# Patient Record
Sex: Female | Born: 1960 | Race: White | Hispanic: No | Marital: Married | Smoking: Current every day smoker
Health system: Southern US, Community
[De-identification: ages and names within clinical notes are randomized; demographics above are authoritative.]

## PROBLEM LIST (undated history)

## (undated) DIAGNOSIS — J449 Chronic obstructive pulmonary disease, unspecified: Secondary | ICD-10-CM

## (undated) DIAGNOSIS — F419 Anxiety disorder, unspecified: Secondary | ICD-10-CM

## (undated) DIAGNOSIS — K589 Irritable bowel syndrome without diarrhea: Secondary | ICD-10-CM

## (undated) DIAGNOSIS — M797 Fibromyalgia: Secondary | ICD-10-CM

## (undated) DIAGNOSIS — K219 Gastro-esophageal reflux disease without esophagitis: Secondary | ICD-10-CM

## (undated) DIAGNOSIS — E785 Hyperlipidemia, unspecified: Secondary | ICD-10-CM

## (undated) DIAGNOSIS — D3613 Benign neoplasm of peripheral nerves and autonomic nervous system of lower limb, including hip: Secondary | ICD-10-CM

## (undated) DIAGNOSIS — I4719 Other supraventricular tachycardia: Secondary | ICD-10-CM

## (undated) DIAGNOSIS — I471 Supraventricular tachycardia: Secondary | ICD-10-CM

## (undated) DIAGNOSIS — R072 Precordial pain: Secondary | ICD-10-CM

## (undated) HISTORY — DX: Fibromyalgia: M79.7

## (undated) HISTORY — DX: Anxiety disorder, unspecified: F41.9

## (undated) HISTORY — PX: TUBAL LIGATION: SHX77

## (undated) HISTORY — PX: GALLBLADDER SURGERY: SHX652

## (undated) HISTORY — DX: Irritable bowel syndrome, unspecified: K58.9

## (undated) HISTORY — PX: TONSILLECTOMY AND ADENOIDECTOMY: SUR1326

## (undated) HISTORY — DX: Supraventricular tachycardia: I47.1

## (undated) HISTORY — DX: Benign neoplasm of peripheral nerves and autonomic nervous system of lower limb, including hip: D36.13

## (undated) HISTORY — DX: Other supraventricular tachycardia: I47.19

## (undated) HISTORY — DX: Precordial pain: R07.2

## (undated) HISTORY — PX: VAGINAL PROLAPSE REPAIR: SHX830

## (undated) HISTORY — DX: Chronic obstructive pulmonary disease, unspecified: J44.9

## (undated) HISTORY — DX: Gastro-esophageal reflux disease without esophagitis: K21.9

## (undated) HISTORY — DX: Hyperlipidemia, unspecified: E78.5

---

## 1998-02-12 ENCOUNTER — Ambulatory Visit (HOSPITAL_COMMUNITY): Admission: RE | Admit: 1998-02-12 | Discharge: 1998-02-12 | Payer: Self-pay | Admitting: Obstetrics & Gynecology

## 1998-09-12 ENCOUNTER — Emergency Department (HOSPITAL_COMMUNITY): Admission: EM | Admit: 1998-09-12 | Discharge: 1998-09-12 | Payer: Self-pay | Admitting: Emergency Medicine

## 1999-04-19 ENCOUNTER — Other Ambulatory Visit: Admission: RE | Admit: 1999-04-19 | Discharge: 1999-04-19 | Payer: Self-pay | Admitting: Gynecology

## 1999-09-22 ENCOUNTER — Encounter: Payer: Self-pay | Admitting: Gynecology

## 1999-09-27 ENCOUNTER — Inpatient Hospital Stay (HOSPITAL_COMMUNITY): Admission: RE | Admit: 1999-09-27 | Discharge: 1999-09-28 | Payer: Self-pay | Admitting: Gynecology

## 1999-09-27 ENCOUNTER — Encounter (INDEPENDENT_AMBULATORY_CARE_PROVIDER_SITE_OTHER): Payer: Self-pay

## 2000-09-19 ENCOUNTER — Other Ambulatory Visit: Admission: RE | Admit: 2000-09-19 | Discharge: 2000-09-19 | Payer: Self-pay | Admitting: Gynecology

## 2001-01-20 ENCOUNTER — Encounter: Payer: Self-pay | Admitting: Internal Medicine

## 2001-01-20 ENCOUNTER — Emergency Department (HOSPITAL_COMMUNITY): Admission: EM | Admit: 2001-01-20 | Discharge: 2001-01-20 | Payer: Self-pay | Admitting: Emergency Medicine

## 2001-11-13 ENCOUNTER — Other Ambulatory Visit: Admission: RE | Admit: 2001-11-13 | Discharge: 2001-11-13 | Payer: Self-pay | Admitting: Gynecology

## 2003-03-10 ENCOUNTER — Encounter: Payer: Self-pay | Admitting: Endocrinology

## 2003-03-10 ENCOUNTER — Encounter: Admission: RE | Admit: 2003-03-10 | Discharge: 2003-03-10 | Payer: Self-pay | Admitting: Endocrinology

## 2003-08-14 ENCOUNTER — Emergency Department (HOSPITAL_COMMUNITY): Admission: EM | Admit: 2003-08-14 | Discharge: 2003-08-14 | Payer: Self-pay

## 2003-08-14 ENCOUNTER — Encounter: Payer: Self-pay | Admitting: Emergency Medicine

## 2004-01-20 ENCOUNTER — Ambulatory Visit (HOSPITAL_COMMUNITY): Admission: RE | Admit: 2004-01-20 | Discharge: 2004-01-20 | Payer: Self-pay | Admitting: Gastroenterology

## 2005-03-27 ENCOUNTER — Other Ambulatory Visit: Admission: RE | Admit: 2005-03-27 | Discharge: 2005-03-27 | Payer: Self-pay | Admitting: Obstetrics and Gynecology

## 2006-03-22 ENCOUNTER — Emergency Department (HOSPITAL_COMMUNITY): Admission: EM | Admit: 2006-03-22 | Discharge: 2006-03-22 | Payer: Self-pay | Admitting: Emergency Medicine

## 2006-04-25 ENCOUNTER — Encounter: Payer: Self-pay | Admitting: Emergency Medicine

## 2006-09-05 ENCOUNTER — Encounter: Admission: RE | Admit: 2006-09-05 | Discharge: 2006-09-05 | Payer: Self-pay | Admitting: Endocrinology

## 2007-07-10 ENCOUNTER — Other Ambulatory Visit: Admission: RE | Admit: 2007-07-10 | Discharge: 2007-07-10 | Payer: Self-pay | Admitting: Obstetrics & Gynecology

## 2007-07-12 ENCOUNTER — Encounter: Admission: RE | Admit: 2007-07-12 | Discharge: 2007-07-12 | Payer: Self-pay | Admitting: Obstetrics and Gynecology

## 2008-08-06 ENCOUNTER — Other Ambulatory Visit: Admission: RE | Admit: 2008-08-06 | Discharge: 2008-08-06 | Payer: Self-pay | Admitting: Obstetrics and Gynecology

## 2008-08-12 ENCOUNTER — Encounter: Admission: RE | Admit: 2008-08-12 | Discharge: 2008-08-12 | Payer: Self-pay | Admitting: Family Medicine

## 2009-09-28 ENCOUNTER — Encounter: Admission: RE | Admit: 2009-09-28 | Discharge: 2009-09-28 | Payer: Self-pay | Admitting: Obstetrics and Gynecology

## 2009-12-04 ENCOUNTER — Emergency Department (HOSPITAL_COMMUNITY): Admission: EM | Admit: 2009-12-04 | Discharge: 2009-12-05 | Payer: Self-pay | Admitting: Emergency Medicine

## 2010-08-18 ENCOUNTER — Encounter: Admission: RE | Admit: 2010-08-18 | Discharge: 2010-08-18 | Payer: Self-pay | Admitting: Internal Medicine

## 2010-10-31 ENCOUNTER — Encounter: Admission: RE | Admit: 2010-10-31 | Discharge: 2010-10-31 | Payer: Self-pay | Admitting: Obstetrics & Gynecology

## 2010-12-12 ENCOUNTER — Ambulatory Visit (HOSPITAL_COMMUNITY)
Admission: RE | Admit: 2010-12-12 | Discharge: 2010-12-13 | Payer: Self-pay | Source: Home / Self Care | Attending: Obstetrics & Gynecology | Admitting: Obstetrics & Gynecology

## 2010-12-12 ENCOUNTER — Encounter: Payer: Self-pay | Admitting: Obstetrics & Gynecology

## 2010-12-12 HISTORY — PX: LAPAROSCOPIC VAGINAL HYSTERECTOMY: SUR798

## 2011-03-06 LAB — CBC
HCT: 28.5 % — ABNORMAL LOW (ref 36.0–46.0)
Hemoglobin: 10.3 g/dL — ABNORMAL LOW (ref 12.0–15.0)
Hemoglobin: 9.6 g/dL — ABNORMAL LOW (ref 12.0–15.0)
MCV: 91.1 fL (ref 78.0–100.0)
RDW: 12.4 % (ref 11.5–15.5)
WBC: 12.5 10*3/uL — ABNORMAL HIGH (ref 4.0–10.5)

## 2011-03-06 LAB — PREGNANCY, URINE: Preg Test, Ur: NEGATIVE

## 2011-03-07 LAB — CBC
HCT: 38.5 % (ref 36.0–46.0)
MCH: 32.4 pg (ref 26.0–34.0)
Platelets: 188 10*3/uL (ref 150–400)
RDW: 12.1 % (ref 11.5–15.5)
WBC: 7.6 10*3/uL (ref 4.0–10.5)

## 2011-03-28 LAB — URINALYSIS, ROUTINE W REFLEX MICROSCOPIC
Bilirubin Urine: NEGATIVE
Hgb urine dipstick: NEGATIVE
Ketones, ur: NEGATIVE mg/dL
Nitrite: NEGATIVE
Protein, ur: NEGATIVE mg/dL
Urobilinogen, UA: 0.2 mg/dL (ref 0.0–1.0)
pH: 5.5 (ref 5.0–8.0)

## 2011-03-28 LAB — CBC: RDW: 12.3 % (ref 11.5–15.5)

## 2011-03-28 LAB — COMPREHENSIVE METABOLIC PANEL
AST: 17 U/L (ref 0–37)
Albumin: 3.5 g/dL (ref 3.5–5.2)
Calcium: 8.8 mg/dL (ref 8.4–10.5)
Chloride: 107 mEq/L (ref 96–112)
Creatinine, Ser: 0.78 mg/dL (ref 0.4–1.2)
GFR calc Af Amer: >60 mL/min (ref >60)
Glucose, Bld: 91 mg/dL (ref 70–99)
Potassium: 3.7 mEq/L (ref 3.5–5.1)
Sodium: 136 mEq/L (ref 135–145)
Total Bilirubin: 0.3 mg/dL (ref 0.3–1.2)
Total Protein: 6.6 g/dL (ref 6.0–8.3)

## 2011-03-28 LAB — DIFFERENTIAL
Basophils Absolute: 0 10*3/uL (ref 0.0–0.1)
Basophils Relative: 0 % (ref 0–1)
Eosinophils Absolute: 0.2 10*3/uL (ref 0.0–0.7)
Eosinophils Relative: 3 % (ref 0–5)
Lymphocytes Relative: 28 % (ref 12–46)
Lymphs Abs: 2.5 10*3/uL (ref 0.7–4.0)
Monocytes Absolute: 0.4 10*3/uL (ref 0.1–1.0)
Monocytes Relative: 5 % (ref 3–12)
Neutro Abs: 5.9 10*3/uL (ref 1.7–7.7)
Neutrophils Relative %: 64 % (ref 43–77)

## 2011-03-28 LAB — LIPASE, BLOOD: Lipase: 17 U/L (ref 11–59)

## 2011-03-28 LAB — POCT PREGNANCY, URINE: Preg Test, Ur: NEGATIVE

## 2011-05-12 NOTE — Op Note (Signed)
[name], Kim Morris                     ACCOUNT NO.:  [id number]   MEDICAL RECORD NO.:  [id number]                   PATIENT TYPE:  AMB   LOCATION:  ENDO                                 FACILITY:  [facility]   PHYSICIAN:  Anselmo Rod, M.D.               DATE OF BIRTH:  1961-12-05   DATE OF PROCEDURE:  01/20/2004  DATE OF DISCHARGE:                                 OPERATIVE REPORT   PROCEDURE:  Colonoscopy.   ENDOSCOPIST:  Charna Elizabeth, M.D.   INSTRUMENT USED:  Olympus video colonoscope.   INDICATIONS FOR PROCEDURE:  Change in bowel habits with severe constipation  in a 50 year old white female, rule out colonic polyps, masses, etc.   PREPROCEDURE PREPARATION:  Informed consent was obtained from the patient.  The patient was fasted for eight hours prior to the procedure and prepped  with a bottle of magnesium citrate and a gallon of GoLYTELY the night prior  to the procedure.   PREPROCEDURE PHYSICAL:  Patient with stable vital signs.  Neck supple.  Chest clear to auscultation.  S1 and S2 regular.  Abdomen soft with normal  bowel sounds.   DESCRIPTION OF PROCEDURE:  The patient was placed in the left lateral  decubitus position, sedated with 70 mg of Demerol and 7 mg Versed  intravenously.  Once the patient was adequately sedated, maintained on low  flow oxygen and continuous cardiac monitoring, the Olympus video colonoscope  was advanced into the rectum to the cecum without difficulty.  The  appendiceal orifice and ileocecal valve were clearly visualized and  photographed.  The terminal ileum appeared normal without lesion.  No  masses, polyps, erosions, ulcerations, or diverticula were seen.  Small  internal hemorrhoids were appreciated on retroflexion of the rectum.  The  patient tolerated the procedure well without complications.   IMPRESSION:  1. Normal colonoscopy to the terminal ileum except for small internal     hemorrhoids.  2. No masses or polyps seen.   RECOMMENDATIONS:  1. Continue high fiber diet with liberal fluid intake.  2. Outpatient follow up in the next two weeks for further recommendations.                                               Anselmo Rod, M.D.    JNM/MEDQ  D:  01/20/2004  T:  01/20/2004  Job:  [id number]

## 2011-12-06 ENCOUNTER — Other Ambulatory Visit: Payer: Self-pay | Admitting: Obstetrics & Gynecology

## 2011-12-06 DIAGNOSIS — Z1231 Encounter for screening mammogram for malignant neoplasm of breast: Secondary | ICD-10-CM

## 2011-12-18 ENCOUNTER — Ambulatory Visit: Payer: Managed Care, Other (non HMO)

## 2011-12-18 DIAGNOSIS — J209 Acute bronchitis, unspecified: Secondary | ICD-10-CM

## 2011-12-18 DIAGNOSIS — J029 Acute pharyngitis, unspecified: Secondary | ICD-10-CM

## 2012-01-01 ENCOUNTER — Ambulatory Visit: Payer: Self-pay

## 2012-01-08 ENCOUNTER — Ambulatory Visit: Payer: Self-pay

## 2012-01-15 ENCOUNTER — Ambulatory Visit
Admission: RE | Admit: 2012-01-15 | Discharge: 2012-01-15 | Disposition: A | Discharge status: Home / Self Care | Payer: Managed Care, Other (non HMO) | Source: Ambulatory Visit | Attending: Obstetrics & Gynecology | Admitting: Obstetrics & Gynecology

## 2012-01-15 DIAGNOSIS — Z1231 Encounter for screening mammogram for malignant neoplasm of breast: Secondary | ICD-10-CM

## 2013-07-01 ENCOUNTER — Other Ambulatory Visit: Payer: Self-pay

## 2013-07-01 DIAGNOSIS — Z1231 Encounter for screening mammogram for malignant neoplasm of breast: Secondary | ICD-10-CM

## 2013-07-15 ENCOUNTER — Ambulatory Visit
Admission: RE | Admit: 2013-07-15 | Discharge: 2013-07-15 | Disposition: A | Discharge status: Home / Self Care | Payer: Managed Care, Other (non HMO) | Source: Ambulatory Visit

## 2013-07-15 DIAGNOSIS — Z1231 Encounter for screening mammogram for malignant neoplasm of breast: Secondary | ICD-10-CM

## 2014-08-11 ENCOUNTER — Encounter: Payer: Self-pay | Admitting: Nurse Practitioner

## 2014-08-11 ENCOUNTER — Ambulatory Visit (INDEPENDENT_AMBULATORY_CARE_PROVIDER_SITE_OTHER): Payer: BC Managed Care – PPO | Admitting: Nurse Practitioner

## 2014-08-11 VITALS — BP 116/62 | HR 60 | Ht 61.5 in | Wt 118.0 lb

## 2014-08-11 DIAGNOSIS — B373 Candidiasis of vulva and vagina: Secondary | ICD-10-CM

## 2014-08-11 DIAGNOSIS — B3731 Acute candidiasis of vulva and vagina: Secondary | ICD-10-CM

## 2014-08-11 MED ORDER — FLUCONAZOLE 150 MG PO TABS: 150.0000 mg | ORAL_TABLET | Freq: Once | ORAL | Status: DC

## 2014-08-11 NOTE — Patient Instructions (Signed)
Monilial Vaginitis Vaginitis in a soreness, swelling and redness (inflammation) of the vagina and vulva. Monilial vaginitis is not a sexually transmitted infection. CAUSES  Yeast vaginitis is caused by yeast (candida) that is normally found in your vagina. With a yeast infection, the candida has overgrown in number to a point that upsets the chemical balance. SYMPTOMS   White, thick vaginal discharge.  Swelling, itching, redness and irritation of the vagina and possibly the lips of the vagina (vulva).  Burning or painful urination.  Painful intercourse. DIAGNOSIS  Things that may contribute to monilial vaginitis are:  Postmenopausal and virginal states.  Pregnancy.  Infections.  Being tired, sick or stressed, especially if you had monilial vaginitis in the past.  Diabetes. Good control will help lower the chance.  Birth control pills.  Tight fitting garments.  Using bubble bath, feminine sprays, douches or deodorant tampons.  Taking certain medications that kill germs (antibiotics).  Sporadic recurrence can occur if you become ill. TREATMENT  Your caregiver will give you medication.  There are several kinds of anti monilial vaginal creams and suppositories specific for monilial vaginitis. For recurrent yeast infections, use a suppository or cream in the vagina 2 times a week, or as directed.  Anti-monilial or steroid cream for the itching or irritation of the vulva may also be used. Get your caregiver's permission.  Painting the vagina with methylene blue solution may help if the monilial cream does not work.  Eating yogurt may help prevent monilial vaginitis. HOME CARE INSTRUCTIONS   Finish all medication as prescribed.  Do not have sex until treatment is completed or after your caregiver tells you it is okay.  Take warm sitz baths.  Do not douche.  Do not use tampons, especially scented ones.  Wear cotton underwear.  Avoid tight pants and panty  hose.  Tell your sexual partner that you have a yeast infection. They should go to their caregiver if they have symptoms such as mild rash or itching.  Your sexual partner should be treated as well if your infection is difficult to eliminate.  Practice safer sex. Use condoms.  Some vaginal medications cause latex condoms to fail. Vaginal medications that harm condoms are:  Cleocin cream.  Butoconazole (Femstat).  Terconazole (Terazol) vaginal suppository.  Miconazole (Monistat) (may be purchased over the counter). add OTC hydrocortisone mixed SEEK MEDICAL CARE IF:   You have a temperature by mouth above 102 F (38.9 C).  The infection is getting worse after 2 days of treatment.  The infection is not getting better after 3 days of treatment.  You develop blisters in or around your vagina.  You develop vaginal bleeding, and it is not your menstrual period.  You have pain when you urinate.  You develop intestinal problems.  You have pain with sexual intercourse. Document Released: 09/20/2005 Document Revised: 03/04/2012 Document Reviewed: 06/04/2009 [facility] Patient Information 2015 [place], [place]. This information is not intended to replace advice given to you by your health care provider. Make sure you discuss any questions you have with your health care provider.

## 2014-08-11 NOTE — Progress Notes (Signed)
Subjective:     Patient ID: Kim Morris, female   DOB: 11-11-1961, 53 y.o.   MRN: [id number]  HPI  This 53 yo WM Fe complains of vaginal itching.  Vaginal symptoms started on Sunday with itching and burning.  Some increase of itching last pm.   Recent antibiotics of Augmentin for URI and completed med's on Sunday.  No bladder symptoms. No fever or chills.  Last SA 8/14.   Review of Systems  Constitutional: Negative for fever, chills and fatigue.  Gastrointestinal: Negative.   Genitourinary: Positive for vaginal discharge. Negative for dysuria, urgency, frequency, flank pain, pelvic pain and dyspareunia.  Skin: Negative.   Neurological: Negative.   Psychiatric/Behavioral: Negative.        Objective:   Physical Exam  Constitutional: She is oriented to person, place, and time. She appears well-developed and well-nourished.  Abdominal: Soft. She exhibits no distension. There is no tenderness. There is no rebound and no guarding.  Genitourinary:  White thick vaginal discharge and external irritation. Wet prep: PH: 6.0, NSS: no clue cells, KOH: + yeast.  Musculoskeletal: Normal range of motion.  Neurological: She is alert and oriented to person, place, and time.  Psychiatric: She has a normal mood and affect. Her behavior is normal. Judgment and thought content normal.       Assessment:     Yeast vaginitis following Augmentin therapy for URI     Plan:    Diflucan 150 mg today and repeat in 3 days. May use OTC Monistat and Cortisone mixed for the external irritation.

## 2014-08-16 NOTE — Progress Notes (Signed)
Encounter reviewed by Dr. Brook Silva.  

## 2014-09-16 ENCOUNTER — Other Ambulatory Visit: Payer: Self-pay

## 2014-09-16 DIAGNOSIS — Z1231 Encounter for screening mammogram for malignant neoplasm of breast: Secondary | ICD-10-CM

## 2014-09-30 ENCOUNTER — Ambulatory Visit
Admission: RE | Admit: 2014-09-30 | Discharge: 2014-09-30 | Disposition: A | Discharge status: Home / Self Care | Payer: BC Managed Care – PPO | Source: Ambulatory Visit

## 2014-09-30 DIAGNOSIS — Z1231 Encounter for screening mammogram for malignant neoplasm of breast: Secondary | ICD-10-CM

## 2014-10-08 ENCOUNTER — Encounter: Payer: Self-pay | Admitting: Obstetrics & Gynecology

## 2014-10-09 ENCOUNTER — Ambulatory Visit
Admission: RE | Admit: 2014-10-09 | Discharge: 2014-10-09 | Disposition: A | Discharge status: Home / Self Care | Payer: BC Managed Care – PPO | Source: Ambulatory Visit | Attending: Obstetrics & Gynecology | Admitting: Obstetrics & Gynecology

## 2014-10-09 ENCOUNTER — Ambulatory Visit (INDEPENDENT_AMBULATORY_CARE_PROVIDER_SITE_OTHER): Payer: BC Managed Care – PPO | Admitting: Obstetrics & Gynecology

## 2014-10-09 ENCOUNTER — Encounter: Payer: Self-pay | Admitting: Obstetrics & Gynecology

## 2014-10-09 ENCOUNTER — Other Ambulatory Visit: Payer: Self-pay | Admitting: Obstetrics & Gynecology

## 2014-10-09 VITALS — BP 100/60 | HR 68 | Resp 16 | Ht 61.75 in | Wt 118.0 lb

## 2014-10-09 DIAGNOSIS — Z01419 Encounter for gynecological examination (general) (routine) without abnormal findings: Secondary | ICD-10-CM

## 2014-10-09 DIAGNOSIS — R634 Abnormal weight loss: Secondary | ICD-10-CM

## 2014-10-09 DIAGNOSIS — M797 Fibromyalgia: Secondary | ICD-10-CM

## 2014-10-09 DIAGNOSIS — Z0000 Encounter for general adult medical examination without abnormal findings: Secondary | ICD-10-CM

## 2014-10-09 DIAGNOSIS — Z124 Encounter for screening for malignant neoplasm of cervix: Secondary | ICD-10-CM

## 2014-10-09 DIAGNOSIS — N951 Menopausal and female climacteric states: Secondary | ICD-10-CM

## 2014-10-09 LAB — POCT URINALYSIS DIPSTICK
Bilirubin, UA: NEGATIVE
Glucose, UA: NEGATIVE
Ketones, UA: NEGATIVE
Leukocytes, UA: NEGATIVE
NITRITE UA: NEGATIVE
PROTEIN UA: NEGATIVE
RBC UA: NEGATIVE
Urobilinogen, UA: NEGATIVE
pH, UA: 5

## 2014-10-09 NOTE — Progress Notes (Signed)
53 y.o. [id number] MarriedCaucasianF here for annual exam.  Got new job this past May.  Full time.  Likes job.    H/O LAVH 12/11.  No vaginal bleeding since then.  Reports she is cold natured.  Having some mild hot flashes.  No vaginal dryness.    Did have blood work with August, this summer.  This was done with Dr. Noah Delaine.  Cholesterol was better.    Pt lost some weight due to stress.  Had stomach issues and pain.  Had colonoscopy and endoscopy. Some of this has improved.  She is eating better.  Gluten testing was negative.    Patient's last menstrual period was 11/24/2010.          Sexually active: Yes.    The current method of family planning is status post hysterectomy.    Exercising: No.  not regulary Smoker:  Yes 3/4-1 PPD  Health Maintenance: Pap:  08/10/10-endometrial cells on pap-had [place] and endometrial biopsy (LAVH 12/11) History of abnormal Pap:  yes MMG:  09/30/14-normal Colonoscopy:  08/14/14-repeat in 10 years BMD:   none TDaP:  2008 Screening Labs: PCP, Hb today: PCP, Urine today: negative   reports that she has been smoking Cigarettes.  She has been smoking about 0.00 packs per day. She has never used smokeless tobacco. She reports that she drinks alcohol. She reports that she does not use illicit drugs.  Past Medical History  Diagnosis Date  . Osteoporosis   . IBS (irritable bowel syndrome)   . Anxiety   . Fibromyalgia   . PAT (paroxysmal atrial tachycardia)     uses Rx PRN    Past Surgical History  Procedure Laterality Date  . Vaginal prolapse repair    . Abdominal hysterectomy      LAVH  . Cesarean section    . Tubal ligation    . Gallbladder surgery    . Tonsillectomy and adenoidectomy      Current Outpatient Prescriptions  Medication Sig Dispense Refill  . b complex vitamins tablet Take 1 tablet by mouth daily.      . cholecalciferol (VITAMIN D) 1000 UNITS tablet Take 1,000 Units by mouth daily.      . cyclobenzaprine (FLEXERIL) 10 MG tablet Take 10  mg by mouth as needed for muscle spasms.      Marland Kitchen LORazepam (ATIVAN) 1 MG tablet Take 1 mg by mouth every 8 (eight) hours.      . Multiple Vitamins-Minerals (EYE VITAMINS PO) Take by mouth.      Marland Kitchen omeprazole (PRILOSEC) 40 MG capsule Take 40 mg by mouth daily.      . Polyethylene Glycol 3350 (MIRALAX PO) Take by mouth.      . psyllium (METAMUCIL) 58.6 % packet Take 1 packet by mouth daily.      . Simethicone (GAS-X PO) Take by mouth.       No current facility-administered medications for this visit.    Family History  Problem Relation Age of Onset  . Hypertension Mother   . Cancer Father     kidney  . Diabetes Father   . Cancer Maternal Grandmother     liver  . Diabetes Maternal Grandfather   . Hypertension Maternal Grandfather   . Cancer Paternal Uncle     pancreatic  . Other Mother     PAT-mother, sister, maternal grandmother  . Thyroid disease Maternal Grandmother     ROS:  Pertinent items are noted in HPI.  Otherwise, a comprehensive ROS was negative.  Exam:   BP 100/60  Pulse 68  Resp 16  Ht 5' 1.75" (1.568 m)  Wt 118 lb (53.524 kg)  BMI 21.77 kg/m2  LMP 11/24/2010   Height: 5' 1.75" (156.8 cm)  Ht Readings from Last 3 Encounters:  10/09/14 5' 1.75" (1.568 m)  08/11/14 5' 1.5" (1.562 m)    General appearance: alert, cooperative and appears stated age Head: Normocephalic, without obvious abnormality, atraumatic Neck: no adenopathy, supple, symmetrical, trachea midline and thyroid normal to inspection and palpation Lungs: clear to auscultation bilaterally Breasts: normal appearance, no masses or tenderness Heart: regular rate and rhythm Abdomen: soft, non-tender; bowel sounds normal; no masses,  no organomegaly Extremities: extremities normal, atraumatic, no cyanosis or edema Skin: Skin color, texture, turgor normal. No rashes or lesions Lymph nodes: Cervical, supraclavicular, and axillary nodes normal. No abnormal inguinal nodes palpated Neurologic: Grossly  normal   Pelvic: External genitalia:  no lesions              Urethra:  normal appearing urethra with no masses, tenderness or lesions              Bartholins and Skenes: normal                 Vagina: normal appearing vagina with normal color and discharge, no lesions              Cervix: absent              Pap taken: No. Bimanual Exam:  Uterus:  uterus absent              Adnexa: normal adnexa and no mass, fullness, tenderness               Rectovaginal: Confirms               Anus:  normal sphincter tone, no lesions  A:  Well Woman with normal exam S/P LAVH 12/11 Smoker H/O fibromyalgia Weight loss Mild hot flashes  P:   Mammogram yearly pap smear not indicated CXR--order given [facility] today return annually or prn  An After Visit Summary was printed and given to the patient.

## 2014-10-10 LAB — FOLLICLE STIMULATING HORMONE: FSH: 6.1 m[IU]/mL

## 2014-10-13 ENCOUNTER — Telehealth: Payer: Self-pay

## 2014-10-13 NOTE — Telephone Encounter (Signed)
Lmtcb//kn 

## 2014-10-13 NOTE — Telephone Encounter (Signed)
Returning call.

## 2014-10-13 NOTE — Telephone Encounter (Signed)
Message copied by Robley Fries on Tue Oct 13, 2014 10:58 AM ------      Message from: Megan Salon      Created: Mon Oct 12, 2014 11:23 PM       Inform chest x ray was normal.  Thanks. ------

## 2014-10-15 NOTE — Telephone Encounter (Signed)
Patient informed of messages from Dr. Sabra Heck. Verbalized understanding. Will follow up prn.  Routing to provider for final review. Patient agreeable to disposition. Will close encounter

## 2014-10-15 NOTE — Telephone Encounter (Signed)
Message copied by Michele Mcalpine on Thu Oct 15, 2014 10:38 AM ------      Message from: Megan Salon      Created: Wed Oct 14, 2014  8:27 AM       Inform pt FSH is not in menopausal range.  She is still making estrogen.  Urine micro was negative. ------

## 2014-10-26 ENCOUNTER — Encounter: Payer: Self-pay | Admitting: Obstetrics & Gynecology

## 2015-01-31 ENCOUNTER — Ambulatory Visit (INDEPENDENT_AMBULATORY_CARE_PROVIDER_SITE_OTHER): Payer: BLUE CROSS/BLUE SHIELD

## 2015-01-31 ENCOUNTER — Ambulatory Visit (INDEPENDENT_AMBULATORY_CARE_PROVIDER_SITE_OTHER): Payer: BLUE CROSS/BLUE SHIELD | Admitting: Internal Medicine

## 2015-01-31 VITALS — BP 128/78 | HR 83 | Temp 98.2°F | Resp 16 | Ht 62.5 in | Wt 117.0 lb

## 2015-01-31 DIAGNOSIS — R05 Cough: Secondary | ICD-10-CM

## 2015-01-31 DIAGNOSIS — R062 Wheezing: Secondary | ICD-10-CM

## 2015-01-31 DIAGNOSIS — R059 Cough, unspecified: Secondary | ICD-10-CM

## 2015-01-31 DIAGNOSIS — J0100 Acute maxillary sinusitis, unspecified: Secondary | ICD-10-CM

## 2015-01-31 MED ORDER — AMOXICILLIN 875 MG PO TABS: 875.0000 mg | ORAL_TABLET | Freq: Two times a day (BID) | ORAL | Status: DC

## 2015-01-31 MED ORDER — PREDNISONE 20 MG PO TABS: ORAL_TABLET | ORAL | Status: DC

## 2015-01-31 NOTE — Progress Notes (Addendum)
   Subjective:  This chart was scribed for Tami Lin, MD by Dellis Filbert, ED Scribe at [facility].The patient was seen in exam room 05 and the patient's care was started at 11:31 AM.   Patient ID: Kim Morris, female    DOB: [birth year], 54 y.o.   MRN: [id number] Chief Complaint  Patient presents with  . Nasal Congestion    x3 days  . Sinusitis  . chest congestion  . Cough    clear sputum  . Back Pain  . Chest Pain   HPI HPI Comments: Kim Morris is a 54 y.o. female with a history of fibromyalgia who presents to [facility] complaining of chest and nasal congestion which began 3 days ago. On the first day she had a sore throat but this has improved. She has a cough which was worsened by mucinex. She has rhinorrhea. No fever, asthma or allergies. Has fibromyalgia, while laying on the heating pad for her back pain, she noticed CP. Deep breathing and movement causes pain in her ribs.  Smoker  Review of Systems  Constitutional: Negative for fever.  HENT: Positive for congestion and rhinorrhea.   Respiratory: Positive for cough.   Cardiovascular: Positive for chest pain.  Musculoskeletal: Positive for back pain.      Objective:   Physical Exam  Constitutional: She is oriented to person, place, and time. She appears well-developed and well-nourished. No distress.  HENT:  Head: Normocephalic and atraumatic.  Nose: Nose normal.  Mouth/Throat: No oropharyngeal exudate.  Turbinates are boggy with purulent discharge. Tender maxillary areas to percussion. The throat is clear except postnasal drainage. TMs intact.  Eyes: Conjunctivae and EOM are normal. Pupils are equal, round, and reactive to light.  Neck: Normal range of motion. No thyromegaly present.  Cardiovascular: Normal rate, regular rhythm and normal heart sounds.   No murmur heard. Pulmonary/Chest: Effort normal. No respiratory distress.  Wheezing bilaterally with expiration Rhonchi on the left  posteriorly  Chest wall TTP on the axillary area to the left.   Musculoskeletal: Normal range of motion.  Lymphadenopathy:    She has no cervical adenopathy.  Neurological: She is alert and oriented to person, place, and time.  Skin: Skin is warm and dry.  Psychiatric: She has a normal mood and affect. Her behavior is normal.  Nursing note and vitals reviewed.    [facility] reading (PRIMARY) by  Dr. 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 amoxicillin (AMOXIL) 875 MG tablet    Sig: Take 1 tablet (875 mg total) by mouth 2 (two) times daily.    Dispense:  20 tablet    Refill:  0  . predniSONE (DELTASONE) 20 MG tablet    Sig: 3/3/2/2/1/1 single daily dose for 6 days    Dispense:  12 tablet    Refill:  0   she's never used an inhaler before Follow-up in 3-4 days if not improving  I have completed the patient encounter in its entirety as documented by the scribe, with editing by me where necessary. Zorian Gunderman P. Laney Pastor, M.D.

## 2015-02-08 ENCOUNTER — Telehealth: Payer: Self-pay | Admitting: Obstetrics & Gynecology

## 2015-02-08 NOTE — Telephone Encounter (Signed)
Patient calling requesting an Rx over the phone for "a yeast infection." She declined an appointment. Pharmacy on file is correct.

## 2015-02-08 NOTE — Telephone Encounter (Signed)
Spoke with patient. She states she is on antibiotic treatment and gets a yeast infection when she has takes abx, today is her last day of treatment. Patient denies vaginal discharge. States she is having external itching only. Offered office visit for evaluation and patient declines, she was late to work this morning due to inclement weather. Advised can take OTC Monistat 3 or 7 day treatment and add OTC hydrocortisone ointment for external irritation.  Patient advised to call back if not improved with treatment. She is agreeable.  Routing to provider for final review. Patient agreeable to disposition. Will close encounter

## 2015-03-31 ENCOUNTER — Other Ambulatory Visit: Payer: Self-pay | Admitting: Dermatology

## 2015-12-03 ENCOUNTER — Ambulatory Visit (INDEPENDENT_AMBULATORY_CARE_PROVIDER_SITE_OTHER): Payer: BLUE CROSS/BLUE SHIELD | Admitting: Obstetrics & Gynecology

## 2015-12-03 ENCOUNTER — Encounter: Payer: Self-pay | Admitting: Obstetrics & Gynecology

## 2015-12-03 VITALS — BP 100/66 | HR 86 | Resp 18 | Ht 61.5 in | Wt 121.0 lb

## 2015-12-03 DIAGNOSIS — Z01419 Encounter for gynecological examination (general) (routine) without abnormal findings: Secondary | ICD-10-CM | POA: Diagnosis not present

## 2015-12-03 DIAGNOSIS — Z0000 Encounter for general adult medical examination without abnormal findings: Secondary | ICD-10-CM | POA: Diagnosis not present

## 2015-12-03 LAB — POCT URINALYSIS DIPSTICK
Bilirubin, UA: NEGATIVE
Blood, UA: NEGATIVE
GLUCOSE UA: NEGATIVE
Ketones, UA: NEGATIVE
LEUKOCYTES UA: NEGATIVE
Nitrite, UA: NEGATIVE
Protein, UA: NEGATIVE
Urobilinogen, UA: NEGATIVE
pH, UA: 7

## 2015-12-03 NOTE — Patient Instructions (Signed)
Ask Dr. Noah Delaine about pneumonia vaccine.    Ask about testing for Hepatitis C.

## 2015-12-03 NOTE — Progress Notes (Signed)
54 y.o. [id number] MarriedCaucasianF here for annual exam.  Doing well.  No vaginal bleeding.    PCP:  Dr. Noah Delaine.  Pt had blood work this summer.  Cholesterol mildly elevated.  This is being watched.   Pt reports she did try to quit over the summer.  Pt is considering trying again.     Patient's last menstrual period was 11/24/2010.          Sexually active: Yes.    The current method of family planning is status post hysterectomy.    Exercising: No.  The patient does not participate in regular exercise at present. Smoker:  yes  Health Maintenance: Pap:  07/2010 endo Bx LAVH 11/2010 History of abnormal Pap:  yes MMG:  09/30/14 BIRADS1:neg.  Pt aware and she will schedule.    Colonoscopy:  07/2014 repeat 10 years.  BMD:   Never TDaP:  2008 Screening Labs: PCP, Hb today: PCP, Urine today: negative    reports that she has been smoking Cigarettes.  She has been smoking about 1.00 pack per day. She has never used smokeless tobacco. She reports that she drinks alcohol. She reports that she does not use illicit drugs.  Past Medical History  Diagnosis Date  . Osteoporosis   . IBS (irritable bowel syndrome)   . Anxiety   . Fibromyalgia   . PAT (paroxysmal atrial tachycardia) (HCC)     uses Rx PRN  . GERD (gastroesophageal reflux disease)     Past Surgical History  Procedure Laterality Date  . Vaginal prolapse repair    . Abdominal hysterectomy      LAVH  . Cesarean section    . Tubal ligation    . Gallbladder surgery    . Tonsillectomy and adenoidectomy      Current Outpatient Prescriptions  Medication Sig Dispense Refill  . b complex vitamins tablet Take 1 tablet by mouth daily.    . cyclobenzaprine (FLEXERIL) 10 MG tablet Take 10 mg by mouth as needed for muscle spasms.    . Ibuprofen (ADVIL) 200 MG CAPS Take by mouth daily as needed.    Marland Kitchen LORazepam (ATIVAN) 1 MG tablet Take 1 mg by mouth every 8 (eight) hours.    Marland Kitchen omeprazole (PRILOSEC) 40 MG capsule Take 40 mg by mouth  daily.    . polyethylene glycol powder (GLYCOLAX/MIRALAX) powder MIX 1 CAPFUL IN 8OZ OF WATER DAILY AS NEEDED FOR CONSTIPATION  5  . psyllium (METAMUCIL) 58.6 % packet Take 1 packet by mouth daily.    . Simethicone (GAS-X PO) Take by mouth.     No current facility-administered medications for this visit.    Family History  Problem Relation Age of Onset  . Hypertension Mother   . Cancer Father     kidney  . Diabetes Father   . Cancer Maternal Grandmother     liver  . Diabetes Maternal Grandfather   . Hypertension Maternal Grandfather   . Cancer Paternal Uncle     pancreatic  . Other Mother     PAT-mother, sister, maternal grandmother  . Thyroid disease Maternal Grandmother     ROS:  Pertinent items are noted in HPI.  Otherwise, a comprehensive ROS was negative.  Exam:   BP 100/66 mmHg  Pulse 86  Resp 18  Ht 5' 1.5" (1.562 m)  Wt 121 lb (54.885 kg)  BMI 22.50 kg/m2  LMP 11/24/2010  Weight change: +3#   Height: 5' 1.5" (156.2 cm)  Ht Readings from Last 3 Encounters:  12/03/15 5' 1.5" (1.562 m)  01/31/15 5' 2.5" (1.588 m)  10/09/14 5' 1.75" (1.568 m)    General appearance: alert, cooperative and appears stated age Head: Normocephalic, without obvious abnormality, atraumatic Neck: no adenopathy, supple, symmetrical, trachea midline and thyroid normal to inspection and palpation Lungs: clear to auscultation bilaterally Breasts: normal appearance, no masses or tenderness Heart: regular rate and rhythm Abdomen: soft, non-tender; bowel sounds normal; no masses,  no organomegaly Extremities: extremities normal, atraumatic, no cyanosis or edema Skin: Skin color, texture, turgor normal. No rashes or lesions Lymph nodes: Cervical, supraclavicular, and axillary nodes normal. No abnormal inguinal nodes palpated Neurologic: Grossly normal   Pelvic: External genitalia:  no lesions              Urethra:  normal appearing urethra with no masses, tenderness or lesions               Bartholins and Skenes: normal                 Vagina: normal appearing vagina with normal color and discharge, no lesions              Cervix: absent              Pap taken: No. Bimanual Exam:  Uterus:  uterus absent              Adnexa: no mass, fullness, tenderness               Rectovaginal: Confirms               Anus:  normal sphincter tone, no lesions  Chaperone was present for exam.  A:  Well Woman with normal exam S/P LAVH 12/11 Smoker H/O fibromyalgia  P: Mammogram yearly pap smear not indicated Hep C testing discussed.  Pt states she will do with her PCP Also advised pt ask Dr. Noah Delaine about the pneumovax vaccine due to her smoking hx. return annually or prn

## 2016-03-22 ENCOUNTER — Other Ambulatory Visit: Payer: Self-pay

## 2016-03-22 DIAGNOSIS — Z1231 Encounter for screening mammogram for malignant neoplasm of breast: Secondary | ICD-10-CM

## 2016-04-07 IMAGING — CR DG CHEST 2V
2 series · 2 of 2 positions shown · non-contrast
Comparison: None.

CLINICAL DATA: Initial encounter for shortness of breath. Smoker.
Wheezing. Cough.

EXAM:
CHEST  2 VIEW

[PA]
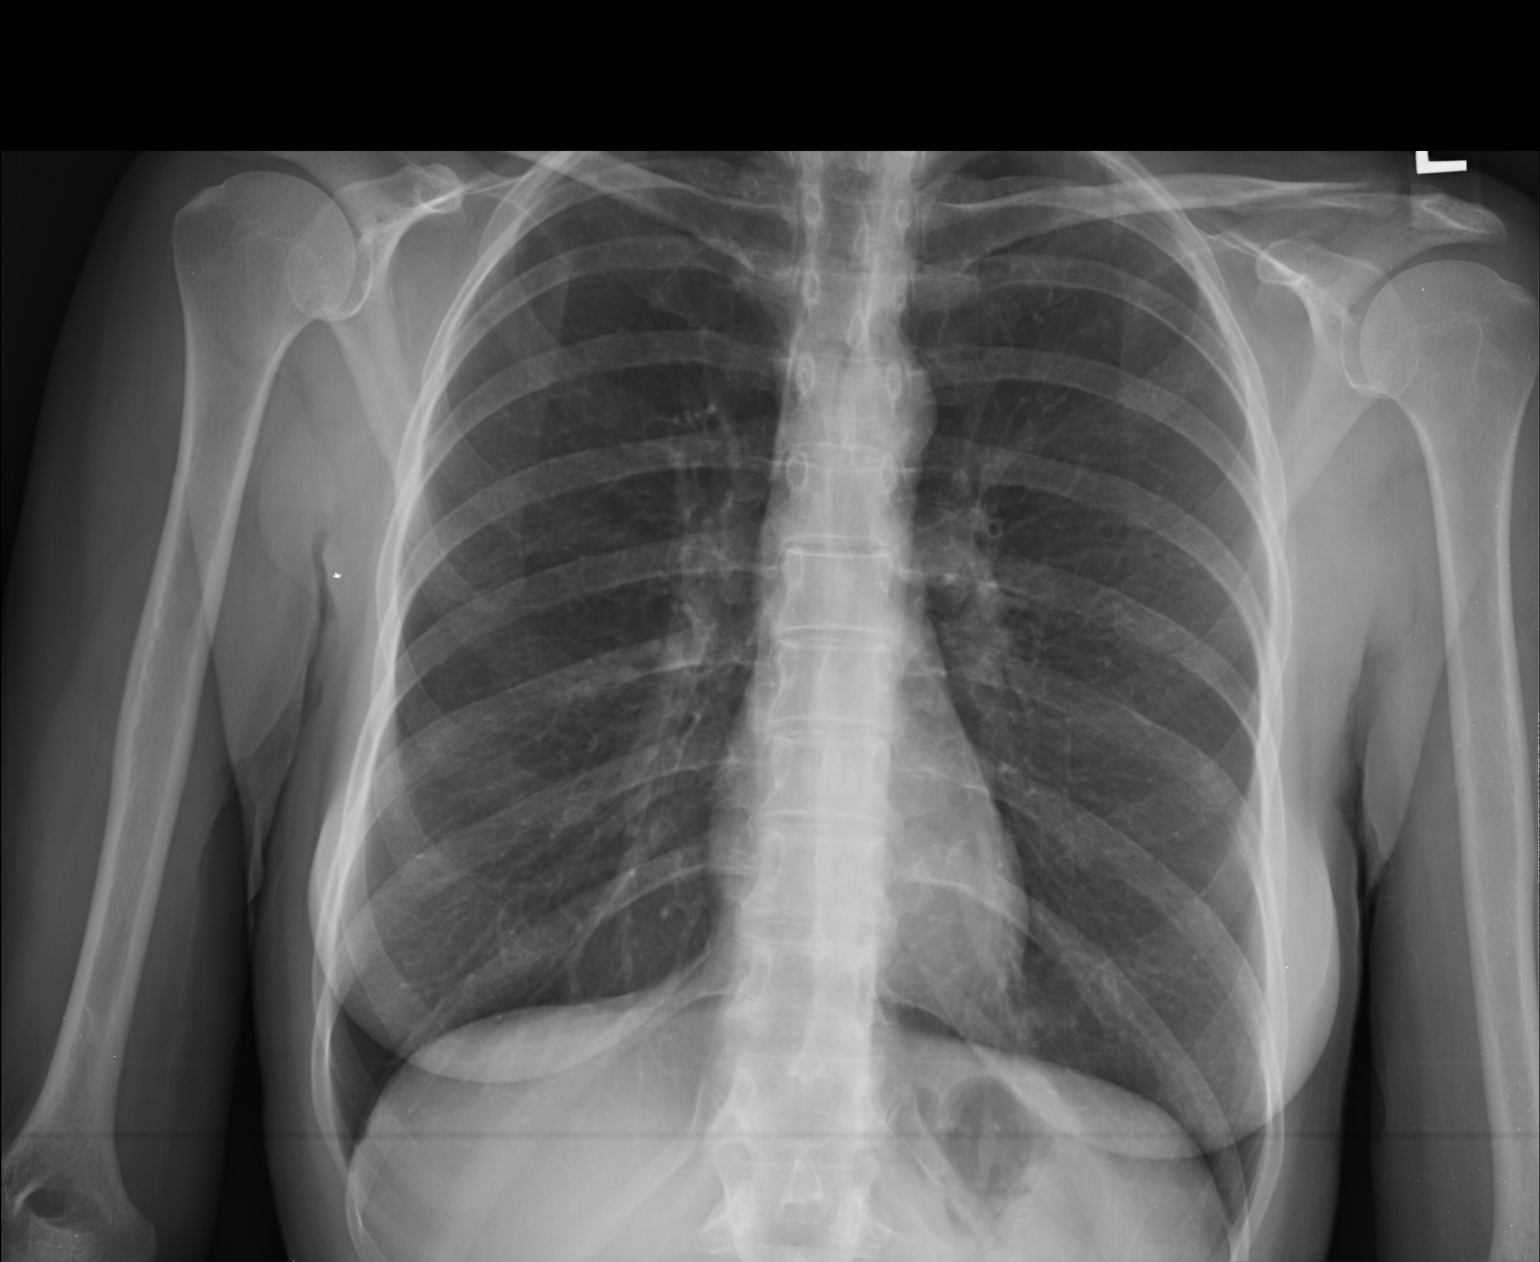

[lateral]
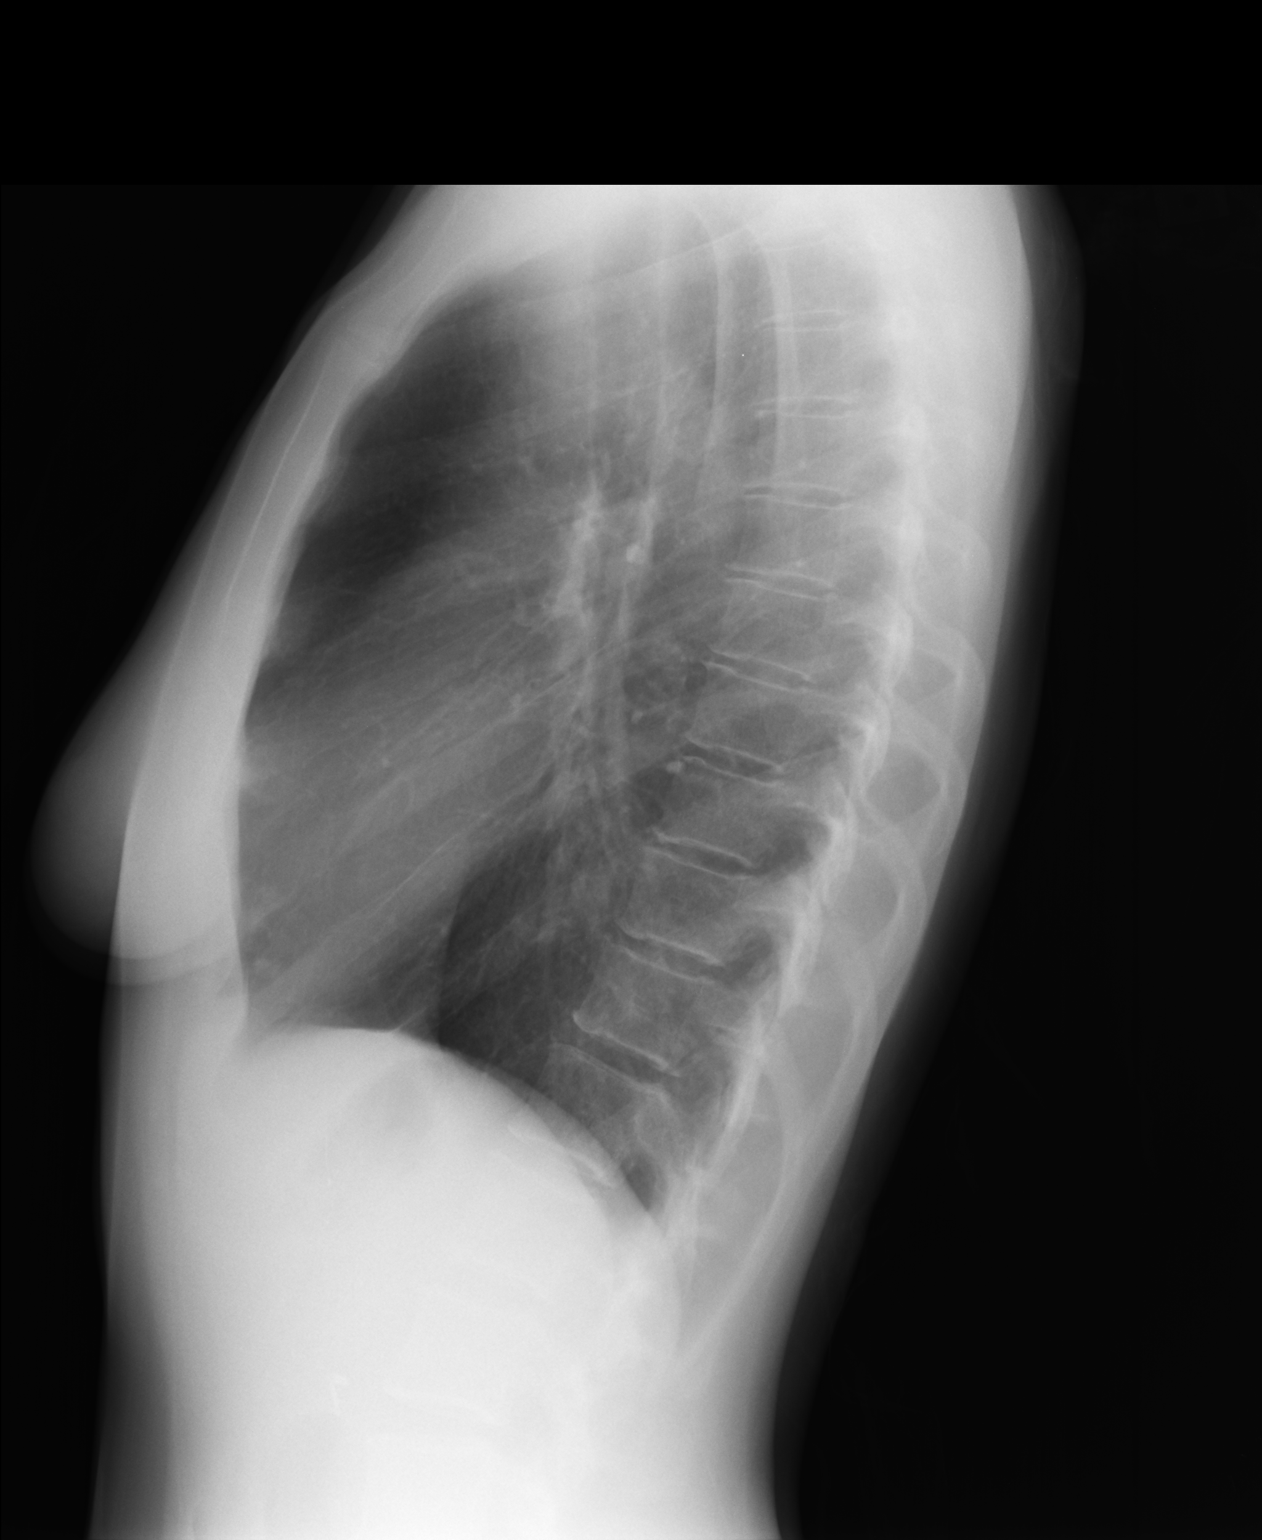

[2 of 2 positions shown; findings below may reference images not displayed]

FINDINGS: Midline trachea. Normal heart size and mediastinal contours. No
pleural effusion or pneumothorax. Mild hyperinflation with lower
lobe predominant interstitial thickening. No lobar consolidation.
IMPRESSION: Hyperinflation and interstitial thickening, likely related to COPD/
chronic bronchitis.

No acute superimposed process.

## 2016-04-19 ENCOUNTER — Ambulatory Visit
Admission: RE | Admit: 2016-04-19 | Discharge: 2016-04-19 | Disposition: A | Discharge status: Home / Self Care | Payer: BLUE CROSS/BLUE SHIELD | Source: Ambulatory Visit

## 2016-04-19 DIAGNOSIS — Z1231 Encounter for screening mammogram for malignant neoplasm of breast: Secondary | ICD-10-CM

## 2016-08-01 ENCOUNTER — Ambulatory Visit (INDEPENDENT_AMBULATORY_CARE_PROVIDER_SITE_OTHER): Payer: BLUE CROSS/BLUE SHIELD | Admitting: Obstetrics and Gynecology

## 2016-08-01 ENCOUNTER — Encounter: Payer: Self-pay | Admitting: Obstetrics and Gynecology

## 2016-08-01 VITALS — BP 130/80 | HR 78 | Resp 14 | Ht 61.5 in | Wt 124.0 lb

## 2016-08-01 DIAGNOSIS — N760 Acute vaginitis: Secondary | ICD-10-CM | POA: Diagnosis not present

## 2016-08-01 DIAGNOSIS — N9489 Other specified conditions associated with female genital organs and menstrual cycle: Secondary | ICD-10-CM

## 2016-08-01 LAB — POCT URINALYSIS DIPSTICK
Bilirubin, UA: NEGATIVE
GLUCOSE UA: NEGATIVE
Ketones, UA: NEGATIVE
Leukocytes, UA: NEGATIVE
Nitrite, UA: NEGATIVE
PH UA: 7
Protein, UA: NEGATIVE
RBC UA: NEGATIVE
UROBILINOGEN UA: NEGATIVE

## 2016-08-01 MED ORDER — BETAMETHASONE VALERATE 0.1 % EX OINT: TOPICAL_OINTMENT | CUTANEOUS | 0 refills | Status: DC

## 2016-08-01 NOTE — Progress Notes (Signed)
GYNECOLOGY  VISIT   HPI: 55 y.o.   Married  Caucasian  female   [phone] with Patient's last menstrual period was 11/24/2010.   here for vulvar burning. Burns when she is just sitting. Symptoms started about 5 days ago, seems to be worsening. Slight itching. Feels irritated. No vaginal discharge.  No change in her urinary frequency or urgency. H/O mild GSI, no change. She has some dysuria, thinks it hurts when the urine hits the skin.  H/O hysterectomy, occasional hot flashes. She has vaginal dryness with intercourse, uses lubricant with intercourse. She tried some over the counter steroid cream for 1 day without help.  GYNECOLOGIC HISTORY: Patient's last menstrual period was 11/24/2010. Contraception: Hysterectomy  Menopausal hormone therapy: None        OB History    Gravida Para Term Preterm AB Living   4 3 3  0 1 3   SAB TAB Ectopic Multiple Live Births   1 0 0 0 3         Patient Active Problem List   Diagnosis Date Noted  . Fibromyalgia 10/09/2014    Past Medical History:  Diagnosis Date  . Anxiety   . Fibromyalgia   . GERD (gastroesophageal reflux disease)   . IBS (irritable bowel syndrome)   . PAT (paroxysmal atrial tachycardia) (HCC)    uses Rx PRN    Past Surgical History:  Procedure Laterality Date  . ABDOMINAL HYSTERECTOMY     LAVH  . CESAREAN SECTION    . GALLBLADDER SURGERY    . TONSILLECTOMY AND ADENOIDECTOMY    . TUBAL LIGATION    . VAGINAL PROLAPSE REPAIR      Current Outpatient Prescriptions  Medication Sig Dispense Refill  . b complex vitamins tablet Take 1 tablet by mouth daily.    . cholecalciferol (VITAMIN D) 1000 units tablet Take 1,000 Units by mouth daily.    . cyclobenzaprine (FLEXERIL) 10 MG tablet Take 10 mg by mouth as needed for muscle spasms.    Marland Kitchen HYDROcodone-acetaminophen (NORCO/VICODIN) 5-325 MG tablet Take 1 tablet by mouth every 6 (six) hours as needed for moderate pain.    . Ibuprofen (ADVIL) 200 MG CAPS Take by mouth daily as  needed.    Marland Kitchen LORazepam (ATIVAN) 1 MG tablet Take 1 mg by mouth every 8 (eight) hours.    . Multiple Vitamins-Minerals (PRESERVISION AREDS 2 PO) Take by mouth daily.    . Omega-3 Fatty Acids (FISH OIL) 1000 MG CAPS Take by mouth.    Marland Kitchen omeprazole (PRILOSEC) 40 MG capsule Take 40 mg by mouth daily.    . polyethylene glycol powder (GLYCOLAX/MIRALAX) powder MIX 1 CAPFUL IN 8OZ OF WATER DAILY AS NEEDED FOR CONSTIPATION  5  . psyllium (METAMUCIL) 58.6 % packet Take 1 packet by mouth daily.    . Simethicone (GAS-X PO) Take by mouth.     No current facility-administered medications for this visit.      ALLERGIES: Magnesium-containing compounds; Morphine and related; Silvadene [silver sulfadiazine]; and Sulfur  Family History  Problem Relation Age of Onset  . Hypertension Mother   . Other Mother     PAT-mother, sister, maternal grandmother  . Cancer Father     kidney  . Diabetes Father   . Cancer Maternal Grandmother     liver  . Thyroid disease Maternal Grandmother   . Diabetes Maternal Grandfather   . Hypertension Maternal Grandfather   . Cancer Paternal Uncle     pancreatic    Social History  Social History  . Marital status: Married    Spouse name: N/A  . Number of children: N/A  . Years of education: N/A   Occupational History  . Not on file.   Social History Main Topics  . Smoking status: Current Every Day Smoker    Packs/day: 1.00    Types: Cigarettes  . Smokeless tobacco: Never Used     Comment: 3/4-1 PPD  . Alcohol use Yes     Comment: socially-3 per month  . Drug use: No  . Sexual activity: Yes    Partners: Male    Birth control/ protection: Surgical     Comment: LAVH   Other Topics Concern  . Not on file   Social History Narrative  . No narrative on file    Review of Systems  Constitutional: Negative.   HENT: Negative.   Eyes: Negative.   Respiratory: Negative.   Cardiovascular: Negative.   Gastrointestinal: Negative.   Genitourinary: Positive  for dysuria and frequency.  Musculoskeletal: Negative.   Skin: Negative.   Neurological: Negative.   Endo/Heme/Allergies: Negative.   Psychiatric/Behavioral: Negative.     PHYSICAL EXAMINATION:    BP 130/80 (BP Location: Left Arm, Patient Position: Sitting, Cuff Size: Normal)   Pulse 78   Resp 14   Ht 5' 1.5" (1.562 m)   Wt 124 lb (56.2 kg)   LMP 11/24/2010   BMI 23.05 kg/m      General appearance: alert, cooperative and appears stated age  Pelvic: External genitalia:  no lesions              Urethra:  normal appearing urethra with no masses, tenderness or lesions              Bartholins and Skenes: normal                 Vagina: normal appearing vagina with normal color and discharge, no lesions, mild atrophy              Cervix: absent               Chaperone was present for exam.  Wet prep: +artifact, no clear clue, no trich, + wbc KOH: no yeast PH: 4-4.5  Urine dip: negative  ASSESSMENT Vulvo-vaginitis, vaginal slides not clear PMP, mild vaginal atrophy  PLAN Will send wet prep probe Treat with steroid ointment If no vaginitis on the probe and symptoms don't improve with the steroid ointment, would recommend vaginal estrogen Reviewed vulvar skin care    An After Visit Summary was printed and given to the patient.  15 minutes face to face time of which over 50% was spent in counseling.

## 2016-08-02 LAB — WET PREP BY MOLECULAR PROBE
Candida species: NEGATIVE
GARDNERELLA VAGINALIS: NEGATIVE
Trichomonas vaginosis: NEGATIVE

## 2016-08-18 ENCOUNTER — Telehealth: Payer: Self-pay | Admitting: Obstetrics and Gynecology

## 2016-08-18 MED ORDER — ESTROGENS, CONJUGATED 0.625 MG/GM VA CREA: TOPICAL_CREAM | VAGINAL | 2 refills | Status: DC

## 2016-08-18 NOTE — Telephone Encounter (Signed)
Spoke with patient. Advised of message as seen below from [place]. Patient is agreeable and verbalizes understanding. All side effects and risks discussed with patient. Patient verbalizes understanding. Rx for Premarin vaginal cream apply 1/2 gram to vagina/vulva nightly for 2 weeks then 1/2 twice per week 30 grams 2RF sent to pharmacy on file. Patient is agreeable. Patient declines to schedule follow up appointment at this time. States her husband is in the process of getting new insurance and she will have to return call to schedule when she knows when this will go into effect.  Cc: Dr.Jertson  Routing to covering provider for final review. Patient agreeable to disposition. Will close encounter.

## 2016-08-18 NOTE — Telephone Encounter (Signed)
Ok for Premarin vaginal cream 1/2 gram total to vagina/vulva nightly for 2 weeks.  Then use 1/2 gram total to vagina/vulva twice weekly at hs. Dispense 30 grams, RF 2.  Please give precautions of increased risk of DVT, PE, MI, stroke, and potential breast cancer.  These are small doses, so the risks should be small.  Follow up with Dr. Talbert Nan in 6 weeks.

## 2016-08-18 NOTE — Telephone Encounter (Signed)
Spoke with patient. Patient reports that she has been using betamethasone valerate ointment twice per week and is still having vulvar burning. States she was advised to contact the office to start vaginal estrogen cream if symptoms persisted. She would like to try this at this time. Advised I will speak with provider regarding recommendations and return call. She is agreeable. Pharmacy on file is correct.  Notes Recorded by Polly Cobia, CMA on 08/02/2016 at 10:05 AM EDT Pt notified. Verbalized understanding. ------  Notes Recorded by Salvadore Dom, MD on 08/02/2016 at 7:41 AM EDT Please inform the patient that her vaginitis culture was negative for infection. If her symptoms aren't improving in the next several days, I would recommend we start vaginal estrogen.  Dr.Silva, please review and advise.

## 2016-08-18 NOTE — Telephone Encounter (Signed)
Patient is having burning and states Dr. Talbert Nan said to call if this happens and she would call in a cream. She is using [facility] on [address].

## 2017-02-22 DIAGNOSIS — J449 Chronic obstructive pulmonary disease, unspecified: Secondary | ICD-10-CM

## 2017-02-22 HISTORY — DX: Chronic obstructive pulmonary disease, unspecified: J44.9

## 2017-03-29 NOTE — Progress Notes (Signed)
56 y.o. [id number] MarriedCaucasianF here for annual exam.  Doing well.  Denies vaginal bleeding.  Having a grandson in six weeks. this will be a first grandson.  Very excited about this.    Diagnosed with COPD earlier this year.  Saw NP at Dr. Noland Fordyce office.  CT was recommended.  Several weeks went by and pt called back and was told CT was denied.  She feels they did not really advocate for her and that she had to follow up on this herself.  Now on Flonase and Breo.  She feels the Memory Dance has helped as she is not feeling the tightness that she was feeling before the x ray was done.  She would like to see a specialist.  She IS working on smoking cessation.  D/W pt starting pneumonia vaccination.  Will be able to do this with the pulmologist.    Patient's last menstrual period was 11/24/2010.          Sexually active: Yes.    The current method of family planning is status post hysterectomy.    Exercising: No.  yoga Smoker: yes  Health Maintenance: Pap:  07/2010 endo Bx LAVH 11/2010 History of abnormal Pap:  yes MMG:  04/19/16 BIRADS1:neg  Colonoscopy:  07/2014 f/u 10 years  BMD:   Never TDaP:  2008 - due  Pneumonia vaccine(s):  No Zostavax:   No Hep C testing: 2017 Neg--done with Dr. Noland Fordyce office Screening Labs: PCP, Urine today: pending   reports that she has been smoking Cigarettes.  She has been smoking about 1.00 pack per day. She has never used smokeless tobacco. She reports that she drinks alcohol. She reports that she does not use drugs.  Past Medical History:  Diagnosis Date  . Anxiety   . COPD (chronic obstructive pulmonary disease) ([facility]) 02/2017  . Fibromyalgia   . GERD (gastroesophageal reflux disease)   . IBS (irritable bowel syndrome)   . PAT (paroxysmal atrial tachycardia) (HCC)    uses Rx PRN    Past Surgical History:  Procedure Laterality Date  . ABDOMINAL HYSTERECTOMY     LAVH  . CESAREAN SECTION    . GALLBLADDER SURGERY    . TONSILLECTOMY AND ADENOIDECTOMY     . TUBAL LIGATION    . VAGINAL PROLAPSE REPAIR      Current Outpatient Prescriptions  Medication Sig Dispense Refill  . b complex vitamins tablet Take 1 tablet by mouth daily.    Marland Kitchen BREO ELLIPTA 100-25 MCG/INH AEPB daily.    . cetirizine (ZYRTEC) 10 MG tablet Take 10 mg by mouth daily.    . cholecalciferol (VITAMIN D) 1000 units tablet Take 1,000 Units by mouth daily.    . cyclobenzaprine (FLEXERIL) 10 MG tablet Take 10 mg by mouth as needed for muscle spasms.    . fluticasone (FLONASE) 50 MCG/ACT nasal spray 2 (two) times daily.    Marland Kitchen HYDROcodone-acetaminophen (NORCO/VICODIN) 5-325 MG tablet Take 1 tablet by mouth every 6 (six) hours as needed for moderate pain.    . Ibuprofen (ADVIL) 200 MG CAPS Take by mouth daily as needed.    Marland Kitchen LORazepam (ATIVAN) 1 MG tablet Take 1 mg by mouth at bedtime.     . Multiple Vitamins-Minerals (PRESERVISION AREDS 2 PO) Take by mouth daily.    . Omega-3 Fatty Acids (FISH OIL) 1000 MG CAPS Take by mouth.    Marland Kitchen omeprazole (PRILOSEC) 40 MG capsule Take 40 mg by mouth daily.    . polyethylene glycol powder (GLYCOLAX/MIRALAX) powder  MIX 1 CAPFUL IN 8OZ OF WATER DAILY AS NEEDED FOR CONSTIPATION  5  . psyllium (METAMUCIL) 58.6 % packet Take 1 packet by mouth daily.    . Simethicone (GAS-X PO) Take by mouth.     No current facility-administered medications for this visit.     Family History  Problem Relation Age of Onset  . Hypertension Mother   . Other Mother     PAT-mother, sister, maternal grandmother  . Cancer Father     kidney  . Diabetes Father   . Cancer Maternal Grandmother     liver  . Thyroid disease Maternal Grandmother   . Diabetes Maternal Grandfather   . Hypertension Maternal Grandfather   . Cancer Paternal Uncle     pancreatic    ROS:  Pertinent items are noted in HPI.  Otherwise, a comprehensive ROS was negative.  Exam:   BP 118/60 (BP Location: Right Arm, Patient Position: Sitting, Cuff Size: Normal)   Pulse 90   Resp 16   Ht 5'  1.5" (1.562 m)   Wt 122 lb (55.3 kg)   LMP 11/24/2010   BMI 22.68 kg/m   Weight change: -1#  Height: 5' 1.5" (156.2 cm)  Ht Readings from Last 3 Encounters:  03/30/17 5' 1.5" (1.562 m)  08/01/16 5' 1.5" (1.562 m)  12/03/15 5' 1.5" (1.562 m)    General appearance: alert, cooperative and appears stated age Head: Normocephalic, without obvious abnormality, atraumatic Neck: no adenopathy, supple, symmetrical, trachea midline and thyroid non-palpable Lungs: clear to auscultation bilaterally Breasts: normal appearance, no masses or tenderness Heart: regular rate and rhythm Abdomen: soft, non-tender; bowel sounds normal; no masses,  no organomegaly Extremities: extremities normal, atraumatic, no cyanosis or edema Skin: Skin color, texture, turgor normal. No rashes or lesions Lymph nodes: Cervical, supraclavicular, and axillary nodes normal. No abnormal inguinal nodes palpated Neurologic: Grossly normal   Pelvic: External genitalia:  no lesions              Urethra:  normal appearing urethra with no masses, tenderness or lesions              Bartholins and Skenes: normal                 Vagina: normal appearing vagina with normal color and discharge, no lesions              Cervix: absent              Pap taken: No. Bimanual Exam:  Uterus:  uterus absent              Adnexa: normal adnexa and no mass, fullness, tenderness               Rectovaginal: Confirms               Anus:  normal sphincter tone, no lesions  Chaperone was present for exam.  A:  Well Woman with normal exam PMP S/p LAVH 12/11 COPD Smoker but working on stopping  Fibromyalgia Vulvar irritation, normal exam  P:   Mammogram guidelines reviewed pap smear not indicated Referral to pulmonology.  Pt will likely start pneumonia vaccines when she has this appt.  Pt advised to contact PCP and get copy of chest xray to take to pulmonology appt.  States she will take care of this. Replens vaginal moisturizer vs  coconut oil vs olive oil topically reviewed.  She does not want to use any topical estrogen products. return annually or  prn

## 2017-03-30 ENCOUNTER — Ambulatory Visit (INDEPENDENT_AMBULATORY_CARE_PROVIDER_SITE_OTHER): Payer: 59 | Admitting: Obstetrics & Gynecology

## 2017-03-30 ENCOUNTER — Encounter: Payer: Self-pay | Admitting: Obstetrics & Gynecology

## 2017-03-30 VITALS — BP 118/60 | HR 90 | Resp 16 | Ht 61.5 in | Wt 122.0 lb

## 2017-03-30 DIAGNOSIS — J441 Chronic obstructive pulmonary disease with (acute) exacerbation: Secondary | ICD-10-CM

## 2017-03-30 DIAGNOSIS — Z01419 Encounter for gynecological examination (general) (routine) without abnormal findings: Secondary | ICD-10-CM

## 2017-03-30 DIAGNOSIS — Z0000 Encounter for general adult medical examination without abnormal findings: Secondary | ICD-10-CM

## 2017-03-30 DIAGNOSIS — Z23 Encounter for immunization: Secondary | ICD-10-CM

## 2017-03-30 DIAGNOSIS — F17200 Nicotine dependence, unspecified, uncomplicated: Secondary | ICD-10-CM

## 2017-03-30 LAB — POCT URINALYSIS DIPSTICK
BILIRUBIN UA: NEGATIVE
Blood, UA: NEGATIVE
GLUCOSE UA: NEGATIVE
KETONES UA: NEGATIVE
LEUKOCYTES UA: NEGATIVE
Nitrite, UA: NEGATIVE
PROTEIN UA: NEGATIVE
Urobilinogen, UA: NEGATIVE (ref ?–2.0)
pH, UA: 7 (ref 5.0–8.0)

## 2017-03-30 NOTE — Patient Instructions (Signed)
Replens moisturizer or olive oil or coconut oil

## 2017-04-17 DIAGNOSIS — M26629 Arthralgia of temporomandibular joint, unspecified side: Secondary | ICD-10-CM | POA: Insufficient documentation

## 2017-06-12 ENCOUNTER — Encounter: Payer: Self-pay | Admitting: Pulmonary Disease

## 2017-06-12 ENCOUNTER — Ambulatory Visit (INDEPENDENT_AMBULATORY_CARE_PROVIDER_SITE_OTHER): Payer: Managed Care, Other (non HMO) | Admitting: Pulmonary Disease

## 2017-06-12 VITALS — BP 122/68 | HR 68 | Ht 62.0 in | Wt 122.0 lb

## 2017-06-12 DIAGNOSIS — J449 Chronic obstructive pulmonary disease, unspecified: Secondary | ICD-10-CM | POA: Insufficient documentation

## 2017-06-12 DIAGNOSIS — J439 Emphysema, unspecified: Secondary | ICD-10-CM | POA: Diagnosis not present

## 2017-06-12 NOTE — Patient Instructions (Addendum)
Lung function is decreased at 70% Stay on BREO once daily - rinse mouth after use  He have to make another attempt to quit smoking. Trial of nicotine patch 14 mg/daily After you have tried this, we can give you a prescription for nicotine inhaler if needed

## 2017-06-12 NOTE — Assessment & Plan Note (Addendum)
Lung function is decreased at 70% Stay on BREO once daily - rinse mouth after use  Emphasized smoking cessation is the most important intervention at this stage -have to make another attempt to quit smoking. Trial of nicotine patch 14 mg/daily After you have tried this, we can give you a prescription for nicotine inhaler if needed  She will start on activity program to avoid gaining weight

## 2017-06-12 NOTE — Progress Notes (Signed)
Subjective:    Patient ID: Kim Morris, female    DOB: [birth year], 55 y.o.   MRN: [id number]  HPI   56 year old smoker presents for evaluation of dyspnea and exertion and abnormal chest x-ray. She smokes about a pack per day since her teenage years-more than 30-pack-years. 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 She reports dyspnea on exertion for the last 6 months sometimes undergoing routine household work. She reports an occasional wheeze about once a week. She reports both sinus and lower airway congestion. She reports a chronic early morning cough productive of minimal brown sputum and occasionally clear. She underwent chest x-ray 02/2017 PCP which showed hyperinflation. I have reviewed these films and also her prior films. She was given breo with seem to help with her breathing  She has tried to quit smoking and 4 occasions and has gone as long as about a week. She has not tried patch and does not want to use the gum. She lives with her husband who also smokes. She denies frequent chest colds   Spirometry showed a ratio 75, FEV1 of 69% and FVC of 72%- although this is more suggestive of mild restriction in particular history this favors airway obstruction    Past Medical History:  Diagnosis Date  . Anxiety   . COPD (chronic obstructive pulmonary disease) ([facility]) 02/2017  . Fibromyalgia   . GERD (gastroesophageal reflux disease)   . IBS (irritable bowel syndrome)   . PAT (paroxysmal atrial tachycardia) (HCC)    uses Rx PRN    Past Surgical History:  Procedure Laterality Date  . ABDOMINAL HYSTERECTOMY     LAVH  . CESAREAN SECTION    . GALLBLADDER SURGERY    . TONSILLECTOMY AND ADENOIDECTOMY    . TUBAL LIGATION    . VAGINAL PROLAPSE REPAIR       Allergies  Allergen Reactions  . Magnesium-Containing Compounds   . Morphine And Related   . Silvadene [Silver Sulfadiazine]   . Sulfur    Social History   Social History  . Marital status:  Married    Spouse name: N/A  . Number of children: N/A  . Years of education: N/A   Occupational History  . Not on file.   Social History Main Topics  . Smoking status: Current Every Day Smoker    Packs/day: 1.00    Years: 36.00    Types: Cigarettes    Start date: 06/12/1981  . Smokeless tobacco: Never Used     Comment: 3/4-1 PPD  . Alcohol use Yes     Comment: socially-3 per month  . Drug use: No  . Sexual activity: Yes    Partners: Male    Birth control/ protection: Surgical     Comment: LAVH   Other Topics Concern  . Not on file   Social History Narrative  . No narrative on file      Family History  Problem Relation Age of Onset  . Hypertension Mother   . Other Mother        PAT-mother, sister, maternal grandmother  . COPD Mother   . Cancer Father        kidney  . Diabetes Father   . Kidney cancer Father   . Cancer Maternal Grandmother        liver  . Thyroid disease Maternal Grandmother   . Hypertension Maternal Grandmother   . Diabetes Maternal Grandmother   . Liver cancer Maternal Grandfather   . Cancer  Paternal Uncle        pancreatic     Review of Systems  Constitutional: Negative for fever and unexpected weight change.  HENT: Negative for congestion, dental problem, ear pain, nosebleeds, postnasal drip, rhinorrhea, sinus pressure, sneezing, sore throat and trouble swallowing.   Eyes: Negative for redness and itching.  Respiratory: Negative for cough, chest tightness, shortness of breath and wheezing.   Cardiovascular: Negative for palpitations and leg swelling.  Gastrointestinal: Negative for nausea and vomiting.  Genitourinary: Negative for dysuria.  Musculoskeletal: Negative for joint swelling.  Skin: Negative for rash.  Allergic/Immunologic: Negative.  Negative for environmental allergies, food allergies and immunocompromised state.  Neurological: Negative for headaches.  Hematological: Does not bruise/bleed easily.    Psychiatric/Behavioral: Negative for dysphoric mood. The patient is not nervous/anxious.        Objective:   Physical Exam   Gen. Pleasant, well-nourished, in no distress, normal affect ENT - no lesions, no post nasal drip Neck: No JVD, no thyromegaly, no carotid bruits Lungs: no use of accessory muscles, no dullness to percussion, decreased BL  without rales or rhonchi  Cardiovascular: Rhythm regular, heart sounds  normal, no murmurs or gallops, no peripheral edema Abdomen: soft and non-tender, no hepatosplenomegaly, BS normal. Musculoskeletal: No deformities, no cyanosis or clubbing Neuro:  alert, non focal        Assessment & Plan:

## 2017-08-03 ENCOUNTER — Telehealth: Payer: Self-pay | Admitting: Pulmonary Disease

## 2017-08-03 MED ORDER — NICOTINE 14 MG/24HR TD PT24
14.0000 mg | MEDICATED_PATCH | Freq: Every day | TRANSDERMAL | 2 refills | Status: DC
Start: 2017-08-03 — End: 2017-10-15

## 2017-08-03 NOTE — Telephone Encounter (Signed)
Spoke with pt, aware of recs.  rx sent to preferred pharmacy.  Nothing further needed.  

## 2017-08-03 NOTE — Telephone Encounter (Signed)
Okay to send #30 x 3 refills

## 2017-08-03 NOTE — Telephone Encounter (Addendum)
Called and spoke with pt. Pt is requesting Rx for nicotine patches.   Per 06/12/17 OV note- RA states trail of nicotine patches 14mg /daily.  It does not appear that this Rx was sent to pharmacy.  RA please advise if okay to send Rx. Thanks.

## 2017-10-15 ENCOUNTER — Ambulatory Visit (INDEPENDENT_AMBULATORY_CARE_PROVIDER_SITE_OTHER): Payer: Managed Care, Other (non HMO) | Admitting: Obstetrics & Gynecology

## 2017-10-15 ENCOUNTER — Telehealth: Payer: Self-pay | Admitting: Obstetrics & Gynecology

## 2017-10-15 ENCOUNTER — Encounter: Payer: Self-pay | Admitting: Obstetrics & Gynecology

## 2017-10-15 ENCOUNTER — Ambulatory Visit: Payer: 59 | Admitting: Obstetrics & Gynecology

## 2017-10-15 VITALS — BP 108/60 | HR 76 | Resp 14 | Wt 129.0 lb

## 2017-10-15 DIAGNOSIS — N951 Menopausal and female climacteric states: Secondary | ICD-10-CM

## 2017-10-15 DIAGNOSIS — R309 Painful micturition, unspecified: Secondary | ICD-10-CM

## 2017-10-15 DIAGNOSIS — N898 Other specified noninflammatory disorders of vagina: Secondary | ICD-10-CM

## 2017-10-15 DIAGNOSIS — R300 Dysuria: Secondary | ICD-10-CM | POA: Diagnosis not present

## 2017-10-15 LAB — POCT URINALYSIS DIPSTICK
Bilirubin, UA: NEGATIVE
Glucose, UA: NEGATIVE
KETONES UA: NEGATIVE
Leukocytes, UA: NEGATIVE
Nitrite, UA: NEGATIVE
PROTEIN UA: NEGATIVE
RBC UA: NEGATIVE
UROBILINOGEN UA: 0.2 U/dL
pH, UA: 7 (ref 5.0–8.0)

## 2017-10-15 MED ORDER — ESTROGENS, CONJUGATED 0.625 MG/GM VA CREA: TOPICAL_CREAM | VAGINAL | 1 refills | Status: DC

## 2017-10-15 MED ORDER — ESTRADIOL 0.05 MG/24HR TD PTTW: 1.0000 | MEDICATED_PATCH | TRANSDERMAL | 2 refills | Status: DC

## 2017-10-15 NOTE — Telephone Encounter (Signed)
Spoke with patient. Patient states that she is having urinary frequency and burning with urination. Denies lower back pain, fever, and chills. Is also having increased hot flashes and night sweats. "They are lasting all night long. I am waking up multiple times a night for 2 weeks now." Advised will need to be seen for further evaluation. Patient is agreeable. Appointment scheduled for 10/15/2017 at 3:45 pm with Dr.Miller. Patient is agreeable to date and time.  Routing to provider for final review. Patient agreeable to disposition. Will close encounter.

## 2017-10-15 NOTE — Telephone Encounter (Signed)
Patient called requesting an appointment for burning and stinging when urinating and problems with her hormones.

## 2017-10-15 NOTE — Progress Notes (Signed)
GYNECOLOGY  VISIT  CC:   Urinary urgency, hot flashes/night sweats  HPI:  56 y.o. G29P3013 Married Caucasian female here for complaints of urinary urgency that has been going on for several seeks as well as significant hot flashes and night sweats.  She is having trouble with sleeping due to the night sweats.  She just feels exhausted.  Wants to talk about options for helping with hot flashes.  Wants to discuss hormonal and non hormonal options.  Pt doesn't feel urinary urgency is like prior UTIs she's had in the past.  Denies dysuria--just feels like she has to go a lot more and doesn't feel like she completely empties.  Denies back pain, pelvic pain, or fever.    GYNECOLOGIC HISTORY: Patient's last menstrual period was 11/24/2010. Contraception: Hysterectomy Menopausal hormone therapy: none  Patient Active Problem List   Diagnosis Date Noted  . COPD (chronic obstructive pulmonary disease) ([place]) 06/12/2017  . Fibromyalgia 10/09/2014    Past Medical History:  Diagnosis Date  . Anxiety   . COPD (chronic obstructive pulmonary disease) ([place]) 02/2017  . Fibromyalgia   . GERD (gastroesophageal reflux disease)   . IBS (irritable bowel syndrome)   . PAT (paroxysmal atrial tachycardia) ([facility])    uses Rx PRN    Past Surgical History:  Procedure Laterality Date  . ABDOMINAL HYSTERECTOMY     [facility]  . CESAREAN SECTION    . GALLBLADDER SURGERY    . TONSILLECTOMY AND ADENOIDECTOMY    . TUBAL LIGATION    . VAGINAL PROLAPSE REPAIR      MEDS:   Current Outpatient Prescriptions on File Prior to Visit  Medication Sig Dispense Refill  . b complex vitamins tablet Take 1 tablet by mouth daily.    Marland Kitchen BREO ELLIPTA 100-25 MCG/INH AEPB daily.    . cetirizine (ZYRTEC) 10 MG tablet Take 10 mg by mouth daily.    . cholecalciferol (VITAMIN D) 1000 units tablet Take 2,000 Units by mouth every other day.     . cyclobenzaprine (FLEXERIL) 10 MG tablet Take 10 mg by mouth as needed for muscle spasms.    .  fluticasone (FLONASE) 50 MCG/ACT nasal spray 2 (two) times daily.    Marland Kitchen HYDROcodone-acetaminophen (NORCO/VICODIN) 5-325 MG tablet Take 1 tablet by mouth every 6 (six) hours as needed for moderate pain.    . Ibuprofen (ADVIL) 200 MG CAPS Take by mouth daily as needed.    Marland Kitchen LORazepam (ATIVAN) 1 MG tablet Take 1 mg by mouth at bedtime.     . Multiple Vitamins-Minerals (PRESERVISION AREDS 2 PO) Take by mouth daily.    . nicotine (NICODERM CQ - DOSED IN MG/24 HOURS) 14 mg/24hr patch Place 1 patch (14 mg total) onto the skin daily. 30 patch 2  . Omega-3 Fatty Acids (FISH OIL) 1000 MG CAPS Take by mouth.    Marland Kitchen omeprazole (PRILOSEC) 40 MG capsule Take 40 mg by mouth daily.    . polyethylene glycol powder (GLYCOLAX/MIRALAX) powder MIX 1 CAPFUL IN 8OZ OF WATER DAILY AS NEEDED FOR CONSTIPATION  5  . psyllium (METAMUCIL) 58.6 % packet Take 1 packet by mouth daily.    . Simethicone (GAS-X PO) Take by mouth.     No current facility-administered medications on file prior to visit.     ALLERGIES: Magnesium-containing compounds; Morphine and related; Silvadene [silver sulfadiazine]; and Sulfur  Family History  Problem Relation Age of Onset  . Hypertension Mother   . Other Mother  PAT-mother, sister, maternal grandmother  . COPD Mother   . Cancer Father        kidney  . Diabetes Father   . Kidney cancer Father   . Cancer Maternal Grandmother        liver  . Thyroid disease Maternal Grandmother   . Hypertension Maternal Grandmother   . Diabetes Maternal Grandmother   . Liver cancer Maternal Grandfather   . Cancer Paternal Uncle        pancreatic    SH:  Married, smoker  Review of Systems  Constitutional: Negative.   Respiratory: Negative.   Cardiovascular: Negative.   Gastrointestinal: Negative.   Genitourinary: Positive for frequency and urgency. Negative for dysuria, flank pain and hematuria.    PHYSICAL EXAMINATION:    BP 108/60 (BP Location: Right Arm, Patient Position: Sitting,  Cuff Size: Normal)   Pulse 76   Resp 14   Wt 129 lb (58.5 kg)   LMP 11/24/2010   BMI 23.59 kg/m     General appearance: alert, cooperative and appears stated age CV:  Regular rate and rhythm Lungs:  clear to auscultation, no wheezes, rales or rhonchi, symmetric air entry Flank: no CVA tendernss Abdomen: soft, non-tender; bowel sounds normal; no masses,  no organomegaly  Pelvic: External genitalia:  no lesions              Urethra:  normal appearing urethra with no masses, tenderness or lesions              Bartholins and Skenes: normal                 Vagina: normal appearing vagina with normal color , watery discharge noted, no lesions              Cervix: absent              Bimanual Exam:  Uterus:  uterus absent              Adnexa: no mass, fullness, tenderness              Anus:  no lesions  Chaperone was present for exam.  Lengthy discussion about hormonal and non-hormonal symptoms occurred.  Although pt states she is really interested in non-hormonal options, she did not like any we discussed.  We proceeded to discuss HRT, risks and benefits, specifically the WHI study including but not limited to risks of increased risks of heart disease, MI, stroke, DVT, and breast cancer.  Increased risks of gall bladder disease and change in cholesterol panels also discussed.  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  Benefits of improved quality of life, improved bone density and decreased risks of colon cancer also discussed.  She is desirous of starting estrogen  Assessment: Vasomotor symptoms Urinary frequency/urgency likely due to hormonal change Watery vaginal discharge  Plan: Urine culture pending.  Affirm pending as well. Will start estradiol patches 0.05mg  twice weekly.  Will recheck in 2-3 months.   ~25 minutes spent with patient >50% of time was in face to face discussion of above.

## 2017-10-16 LAB — URINE CULTURE: Organism ID, Bacteria: NO GROWTH

## 2017-10-16 LAB — VAGINITIS/VAGINOSIS, DNA PROBE
CANDIDA SPECIES: NEGATIVE
GARDNERELLA VAGINALIS: NEGATIVE
TRICHOMONAS VAG: NEGATIVE

## 2017-10-17 ENCOUNTER — Encounter: Payer: Self-pay | Admitting: Obstetrics & Gynecology

## 2017-11-26 ENCOUNTER — Encounter: Payer: Self-pay | Admitting: Adult Health

## 2017-11-26 ENCOUNTER — Encounter: Payer: Self-pay | Admitting: Obstetrics & Gynecology

## 2017-11-26 ENCOUNTER — Other Ambulatory Visit: Payer: Self-pay

## 2017-11-26 ENCOUNTER — Ambulatory Visit (INDEPENDENT_AMBULATORY_CARE_PROVIDER_SITE_OTHER): Payer: Managed Care, Other (non HMO) | Admitting: Adult Health

## 2017-11-26 ENCOUNTER — Ambulatory Visit (INDEPENDENT_AMBULATORY_CARE_PROVIDER_SITE_OTHER): Payer: Managed Care, Other (non HMO) | Admitting: Obstetrics & Gynecology

## 2017-11-26 VITALS — BP 136/70 | HR 72 | Resp 16 | Wt 131.0 lb

## 2017-11-26 DIAGNOSIS — Z720 Tobacco use: Secondary | ICD-10-CM | POA: Diagnosis not present

## 2017-11-26 DIAGNOSIS — J449 Chronic obstructive pulmonary disease, unspecified: Secondary | ICD-10-CM | POA: Diagnosis not present

## 2017-11-26 DIAGNOSIS — Z9229 Personal history of other drug therapy: Secondary | ICD-10-CM | POA: Insufficient documentation

## 2017-11-26 DIAGNOSIS — N951 Menopausal and female climacteric states: Secondary | ICD-10-CM | POA: Diagnosis not present

## 2017-11-26 MED ORDER — ALBUTEROL SULFATE HFA 108 (90 BASE) MCG/ACT IN AERS: 2.0000 | INHALATION_SPRAY | Freq: Four times a day (QID) | RESPIRATORY_TRACT | 2 refills | Status: DC | PRN

## 2017-11-26 NOTE — Patient Instructions (Signed)
Dr. Suzi Roots [address], [place], [place] [address] Phone: [phone]

## 2017-11-26 NOTE — Assessment & Plan Note (Signed)
Smoking cessation  

## 2017-11-26 NOTE — Progress Notes (Signed)
@Patient  ID: Kim Morris, female    DOB: [birth year], 56 y.o.   MRN: [id number]  Chief Complaint  Patient presents with  . Follow-up    COPD     Referring provider: Thressa Sheller, MD  HPI: 56 yo female smoker followed for COPD   TEST  Spirometry showed a ratio 75, FEV1 of 69% and FVC of 72%- although this is more suggestive of mild restriction in particular history this favors airway obstruction   11/26/2017 Follow up : COPD  Pt returns for 6 month follow up for COPD . She is on BREO . Feels her breathing is doing about the same. Gets winded on occasion . No flare of cough or wheezing .  Still smoking . Cessation discussed. Went over helpful hints on quitting  CXR earlier this year with PCP reported as COPD changes.     Allergies  Allergen Reactions  . Magnesium-Containing Compounds   . Morphine And Related   . Silvadene [Silver Sulfadiazine]   . Sulfur     Immunization History  Administered Date(s) Administered  . Influenza Split 08/23/2017  . Tdap 03/30/2017    Past Medical History:  Diagnosis Date  . Anxiety   . COPD (chronic obstructive pulmonary disease) ([place]) 02/2017  . Fibromyalgia   . GERD (gastroesophageal reflux disease)   . IBS (irritable bowel syndrome)   . PAT (paroxysmal atrial tachycardia) ([facility])    uses Rx PRN    Tobacco History: Social History   Tobacco Use  Smoking Status Current Every Day Smoker  . Packs/day: 1.00  . Years: 36.00  . Pack years: 36.00  . Types: Cigarettes  . Start date: 06/12/1981  Smokeless Tobacco Never Used  Tobacco Comment   3/4-1 PPD   Ready to quit: No Counseling given: Yes Comment: 3/4-1 PPD   Outpatient Encounter Medications as of 11/26/2017  Medication Sig  . albuterol (PROAIR HFA) 108 (90 Base) MCG/ACT inhaler Inhale 2 puffs into the lungs every 6 (six) hours as needed for wheezing or shortness of breath.  Marland Kitchen b complex vitamins tablet Take 1 tablet by mouth daily.  Marland Kitchen BREO ELLIPTA 100-25 MCG/INH  AEPB daily.  . cetirizine (ZYRTEC) 10 MG tablet Take 10 mg by mouth daily.  . cholecalciferol (VITAMIN D) 1000 units tablet Take 2,000 Units by mouth every other day.   . conjugated estrogens (PREMARIN) vaginal cream 1/2 gram vaginally twice weekly or externally with a small amount daily  . cyclobenzaprine (FLEXERIL) 10 MG tablet Take 10 mg by mouth as needed for muscle spasms.  Marland Kitchen estradiol (VIVELLE-DOT) 0.05 MG/24HR patch Place 1 patch (0.05 mg total) onto the skin 2 (two) times a week.  . fluticasone (FLONASE) 50 MCG/ACT nasal spray 2 (two) times daily.  Marland Kitchen HYDROcodone-acetaminophen (NORCO/VICODIN) 5-325 MG tablet Take 1 tablet by mouth every 6 (six) hours as needed for moderate pain.  . Ibuprofen (ADVIL) 200 MG CAPS Take by mouth daily as needed.  Marland Kitchen LORazepam (ATIVAN) 1 MG tablet Take 1 mg by mouth at bedtime.   . Multiple Vitamins-Minerals (PRESERVISION AREDS 2 PO) Take by mouth daily.  . Omega-3 Fatty Acids (FISH OIL) 1000 MG CAPS Take by mouth.  Marland Kitchen omeprazole (PRILOSEC) 40 MG capsule Take 40 mg by mouth daily.  . polyethylene glycol powder (GLYCOLAX/MIRALAX) powder MIX 1 CAPFUL IN 8OZ OF WATER DAILY AS NEEDED FOR CONSTIPATION  . psyllium (METAMUCIL) 58.6 % packet Take 1 packet by mouth daily.  . Simethicone (GAS-X PO) Take by mouth.   No  facility-administered encounter medications on file as of 11/26/2017.      Review of Systems  Constitutional:   No  weight loss, night sweats,  Fevers, chills, fatigue, or  lassitude.  HEENT:   No headaches,  Difficulty swallowing,  Tooth/dental problems, or  Sore throat,                No sneezing, itching, ear ache, nasal congestion, post nasal drip,   CV:  No chest pain,  Orthopnea, PND, swelling in lower extremities, anasarca, dizziness, palpitations, syncope.   GI  No heartburn, indigestion, abdominal pain, nausea, vomiting, diarrhea, change in bowel habits, loss of appetite, bloody stools.   Resp:   No excess mucus, no productive cough,  No  non-productive cough,  No coughing up of blood.  No change in color of mucus.  No wheezing.  No chest wall deformity  Skin: no rash or lesions.  GU: no dysuria, change in color of urine, no urgency or frequency.  No flank pain, no hematuria   MS:  No joint pain or swelling.  No decreased range of motion.  No back pain.    Physical Exam  BP 112/64 (BP Location: Left Arm, Cuff Size: Normal)   Pulse (!) 56   Ht 5\' 2"  (1.575 m)   Wt 131 lb 6.4 oz (59.6 kg)   LMP 11/24/2010   SpO2 100%   BMI 24.03 kg/m   GEN: A/Ox3; pleasant , NAD, well nourished.    HEENT:  [place]/AT,  EACs-clear, TMs-wnl, NOSE-clear, THROAT-clear, no lesions, no postnasal drip or exudate noted.   NECK:  Supple w/ fair ROM; no JVD; normal carotid impulses w/o bruits; no thyromegaly or nodules palpated; no lymphadenopathy.    RESP  Clear  P & A; w/o, wheezes/ rales/ or rhonchi. no accessory muscle use, no dullness to percussion  CARD:  RRR, no m/r/g, no peripheral edema, pulses intact, no cyanosis or clubbing.  GI:   Soft & nt; nml bowel sounds; no organomegaly or masses detected.   Musco: Warm bil, no deformities or joint swelling noted.   Neuro: alert, no focal deficits noted.    Skin: Warm, no lesions or rashes    Lab Results:  BNP No results found for: BNP  ProBNP No results found for: PROBNP  Imaging: No results found.   Assessment & Plan:   COPD (chronic obstructive pulmonary disease) ([place]) Controlled on BREO   Plan  Patient Instructions  Continue on BREO daily . Rinse after use.  ProAir 2 puffs every 4hr as needed , wheezing .  Work on not smoking .  Follow up with Dr. Elsworth Soho  In 12  months and As needed        Tobacco abuse Smoking cessation      Rexene Edison, NP 11/26/2017

## 2017-11-26 NOTE — Patient Instructions (Addendum)
Continue on BREO daily . Rinse after use.  ProAir 2 puffs every 4hr as needed , wheezing .  Work on not smoking .  Follow up with Dr. Elsworth Soho  In 12  months and As needed

## 2017-11-26 NOTE — Assessment & Plan Note (Signed)
Controlled on BREO   Plan  Patient Instructions  Continue on BREO daily . Rinse after use.  ProAir 2 puffs every 4hr as needed , wheezing .  Work on not smoking .  Follow up with Dr. Elsworth Soho  In 12  months and As needed

## 2017-11-26 NOTE — Progress Notes (Signed)
GYNECOLOGY  VISIT  CC:   Follow up after starting HRT  HPI: 56 y.o. G82P3013 Married Caucasian female here for follow up after starting HRT.  Reports symptoms improved after about a week.  Reports she had a more visible and prominent vein on left lower leg.  Went to see PCP on Thursday.  Had doppler on LLE.  I can see this on Care Everywhere.  This was negative.  Pain has resolved.  H/O spider veins on this side much more than left leg.     Vaginal dryness has significantly improved.  She is not using the Premarin cream due to significant improvement.  Denies vaginal discharge or odor.    GYNECOLOGIC HISTORY: Patient's last menstrual period was 11/24/2010. Contraception: hysterectomy Menopausal hormone therapy: estradiol patch 0.05mg   Patient Active Problem List   Diagnosis Date Noted  . COPD (chronic obstructive pulmonary disease) ([place]) 06/12/2017  . Temporomandibular joint (TMJ) pain 04/17/2017  . Fibromyalgia 10/09/2014    Past Medical History:  Diagnosis Date  . Anxiety   . COPD (chronic obstructive pulmonary disease) ([place]) 02/2017  . Fibromyalgia   . GERD (gastroesophageal reflux disease)   . IBS (irritable bowel syndrome)   . PAT (paroxysmal atrial tachycardia) ([facility])    uses Rx PRN    Past Surgical History:  Procedure Laterality Date  . CESAREAN SECTION    . GALLBLADDER SURGERY    . LAPAROSCOPIC VAGINAL HYSTERECTOMY  12/12/2010   ovaries remain  . TONSILLECTOMY AND ADENOIDECTOMY    . TUBAL LIGATION    . VAGINAL PROLAPSE REPAIR      MEDS:   Current Outpatient Medications on File Prior to Visit  Medication Sig Dispense Refill  . b complex vitamins tablet Take 1 tablet by mouth daily.    Marland Kitchen BREO ELLIPTA 100-25 MCG/INH AEPB daily.    . cetirizine (ZYRTEC) 10 MG tablet Take 10 mg by mouth daily.    . cholecalciferol (VITAMIN D) 1000 units tablet Take 2,000 Units by mouth every other day.     . conjugated estrogens (PREMARIN) vaginal cream 1/2 gram vaginally twice  weekly or externally with a small amount daily 30 g 1  . cyclobenzaprine (FLEXERIL) 10 MG tablet Take 10 mg by mouth as needed for muscle spasms.    Marland Kitchen estradiol (VIVELLE-DOT) 0.05 MG/24HR patch Place 1 patch (0.05 mg total) onto the skin 2 (two) times a week. 8 patch 2  . fluticasone (FLONASE) 50 MCG/ACT nasal spray 2 (two) times daily.    Marland Kitchen HYDROcodone-acetaminophen (NORCO/VICODIN) 5-325 MG tablet Take 1 tablet by mouth every 6 (six) hours as needed for moderate pain.    . Ibuprofen (ADVIL) 200 MG CAPS Take by mouth daily as needed.    Marland Kitchen LORazepam (ATIVAN) 1 MG tablet Take 1 mg by mouth at bedtime.     . Multiple Vitamins-Minerals (PRESERVISION AREDS 2 PO) Take by mouth daily.    . Omega-3 Fatty Acids (FISH OIL) 1000 MG CAPS Take by mouth.    Marland Kitchen omeprazole (PRILOSEC) 40 MG capsule Take 40 mg by mouth daily.    . polyethylene glycol powder (GLYCOLAX/MIRALAX) powder MIX 1 CAPFUL IN 8OZ OF WATER DAILY AS NEEDED FOR CONSTIPATION  5  . psyllium (METAMUCIL) 58.6 % packet Take 1 packet by mouth daily.    . Simethicone (GAS-X PO) Take by mouth.     No current facility-administered medications on file prior to visit.     ALLERGIES: Magnesium-containing compounds; Morphine and related; Silvadene [silver sulfadiazine]; and Sulfur  Family History  Problem Relation Age of Onset  . Hypertension Mother   . Other Mother        PAT-mother, sister, maternal grandmother  . COPD Mother   . Cancer Father        kidney  . Diabetes Father   . Kidney cancer Father   . Cancer Maternal Grandmother        liver  . Thyroid disease Maternal Grandmother   . Hypertension Maternal Grandmother   . Diabetes Maternal Grandmother   . Liver cancer Maternal Grandfather   . Cancer Paternal Uncle        pancreatic    SH:  Married, smoker  Review of Systems  Constitutional: Negative.   Respiratory: Negative.   Cardiovascular: Negative.   Psychiatric/Behavioral: Negative.     PHYSICAL EXAMINATION:    BP  136/70 (BP Location: Right Arm, Patient Position: Sitting, Cuff Size: Normal)   Pulse 72   Resp 16   Wt 131 lb (59.4 kg)   LMP 11/24/2010   BMI 23.96 kg/m     General appearance: alert, cooperative and appears stated age LLE:  Spider veins noted, no edema, no pain to palpation of calf  No other physical exam performed  Assessment: Vasomotor symptoms much improved with estradiol patch Vaginal dryness that is much improved Left lower leg pain, prominent vein that is also improved.  Negative doppler  Plan: Continue estradiol patch 0.05mg  twice weekly.  Pt will call if she desires this to be a 90 day supply.    ~15 minutes spent with patient >50% of time was in face to face discussion of above.

## 2017-12-06 NOTE — Progress Notes (Signed)
Reviewed & agree with plan  

## 2017-12-24 ENCOUNTER — Other Ambulatory Visit: Payer: Self-pay | Admitting: Pulmonary Disease

## 2017-12-28 ENCOUNTER — Telehealth: Payer: Self-pay | Admitting: Obstetrics & Gynecology

## 2017-12-28 MED ORDER — FLUCONAZOLE 150 MG PO TABS: ORAL_TABLET | ORAL | 0 refills | Status: DC

## 2017-12-28 NOTE — Telephone Encounter (Signed)
Ok to send in rx for Diflucan 150mg  po x 1, repeat 72 hours.  #2/0RF.  Will need appt if symptoms do not resolve.

## 2017-12-28 NOTE — Telephone Encounter (Signed)
Spoke with patient. Patient requesting RX for diflucan. Patient states she completed doxycycline 1 week ago for a tick bite, symptoms started on 12/27/17.   Reports vaginal itching and burning. Denies vaginal d/c, odor, bleeding or urinary complaints.   Patient states she has tried OTC treatments in the past with no relief.   Advised patient will review with Dr. Sabra Heck and return call, patient is agreeable.   Last OV 11/26/17  Dr. Sabra Heck -please advise on diflucan?

## 2017-12-28 NOTE — Telephone Encounter (Signed)
Patient says she has a yeast infection from taking antibiotics and wondering if something can be called in.

## 2017-12-28 NOTE — Telephone Encounter (Signed)
Left detailed message, ok per current dpr. Advised as seen below per Dr. Sabra Heck. F/u with [facility] on [address] for Rx. Return call to office with any additional questions. Will close encounter.

## 2018-01-02 ENCOUNTER — Other Ambulatory Visit: Payer: Self-pay | Admitting: Obstetrics & Gynecology

## 2018-01-02 NOTE — Telephone Encounter (Signed)
Medication refill request: Vivelle Patch  Last AEX:  03-30-17  Next AEX: 06-25-18  Last MMG (if hormonal medication request): 04-19-16 WNL  Refill authorized: please advise

## 2018-03-04 ENCOUNTER — Telehealth: Payer: Self-pay | Admitting: Pulmonary Disease

## 2018-03-04 MED ORDER — NICOTINE 14 MG/24HR TD PT24: 14.0000 mg | MEDICATED_PATCH | Freq: Every day | TRANSDERMAL | 0 refills | Status: DC

## 2018-03-04 MED ORDER — NICOTINE POLACRILEX 2 MG MT LOZG: 2.0000 mg | LOZENGE | OROMUCOSAL | 3 refills | Status: DC | PRN

## 2018-03-04 NOTE — Telephone Encounter (Signed)
Medications have been sent in. 

## 2018-03-04 NOTE — Telephone Encounter (Signed)
14 mg/day Loxzenges  - I think these are 2mg 

## 2018-03-04 NOTE — Telephone Encounter (Signed)
Patient called back, she states that she was worried about taking both the nicotine and estrogen patches together and wanted to know if it was ok to do so. Patient is also requesting a refill of the nicotine patches and well as the lozenges. RA please advise on patients question. Also if we can send in a refill how many. Thanks.

## 2018-03-04 NOTE — Telephone Encounter (Signed)
No interaction as far as I know  Okay to refill nicotine patches and lozenges x 30 x 3 refills

## 2018-03-04 NOTE — Telephone Encounter (Signed)
RA can you clarify the dosage for patches and lozenges?

## 2018-03-04 NOTE — Telephone Encounter (Signed)
Called patient unable to reach left message to give [place] a call back in regards to this.

## 2018-05-22 ENCOUNTER — Other Ambulatory Visit: Payer: Self-pay

## 2018-05-22 ENCOUNTER — Ambulatory Visit (INDEPENDENT_AMBULATORY_CARE_PROVIDER_SITE_OTHER): Payer: BLUE CROSS/BLUE SHIELD | Admitting: Obstetrics & Gynecology

## 2018-05-22 ENCOUNTER — Encounter

## 2018-05-22 ENCOUNTER — Encounter: Payer: Self-pay | Admitting: Obstetrics & Gynecology

## 2018-05-22 VITALS — BP 102/72 | HR 72 | Resp 14 | Ht 62.25 in | Wt 133.2 lb

## 2018-05-22 DIAGNOSIS — Z01419 Encounter for gynecological examination (general) (routine) without abnormal findings: Secondary | ICD-10-CM | POA: Diagnosis not present

## 2018-05-22 DIAGNOSIS — Z0000 Encounter for general adult medical examination without abnormal findings: Secondary | ICD-10-CM

## 2018-05-22 LAB — POCT URINALYSIS DIPSTICK
Appearance: NEGATIVE
BILIRUBIN UA: NEGATIVE
Blood, UA: NEGATIVE
Glucose, UA: NEGATIVE
KETONES UA: NEGATIVE
Leukocytes, UA: NEGATIVE
Nitrite, UA: NEGATIVE
PH UA: 7 (ref 5.0–8.0)
Protein, UA: NEGATIVE
UROBILINOGEN UA: 0.2 U/dL

## 2018-05-22 MED ORDER — ESTRADIOL 0.05 MG/24HR TD PTTW: 1.0000 | MEDICATED_PATCH | TRANSDERMAL | 0 refills | Status: DC

## 2018-05-22 NOTE — Progress Notes (Signed)
57 y.o. [id number] MarriedCaucasianF here for annual exam.  Doing well.  Worked on smoking cessation earlier this year.  Had some stressors so is smoking again.    Denies vaginal bleeding.   PCP:  Stanton Kidney the PA at [facility].    Patient's last menstrual period was 11/24/2010.          Sexually active: Yes.    The current method of family planning is status post hysterectomy.    Exercising: No.   Smoker:  yes  Health Maintenance: Pap:  07/2010 endo cells  History of abnormal Pap:  yes MMG:  04/19/16 BIRADS1:Neg  Colonoscopy:  08/14/14 f/u 10 years  BMD:   Never TDaP:  2018 Pneumonia vaccine(s):  n/a Shingrix:   No.  D/w pt today Hep C testing: by PCP Screening Labs: PCP  [id number]    reports that she has been smoking cigarettes.  She started smoking about 36 years ago. She has a 36.00 pack-year smoking history. She has never used smokeless tobacco. She reports that she drinks alcohol. She reports that she does not use drugs.  Past Medical History:  Diagnosis Date  . Anxiety   . COPD (chronic obstructive pulmonary disease) ([place]) 02/2017  . Fibromyalgia   . GERD (gastroesophageal reflux disease)   . IBS (irritable bowel syndrome)   . PAT (paroxysmal atrial tachycardia) (HCC)    uses Rx PRN    Past Surgical History:  Procedure Laterality Date  . CESAREAN SECTION    . GALLBLADDER SURGERY    . LAPAROSCOPIC VAGINAL HYSTERECTOMY  12/12/2010   ovaries remain  . TONSILLECTOMY AND ADENOIDECTOMY    . TUBAL LIGATION    . VAGINAL PROLAPSE REPAIR      Current Outpatient Medications  Medication Sig Dispense Refill  . albuterol (PROAIR HFA) 108 (90 Base) MCG/ACT inhaler Inhale 2 puffs into the lungs every 6 (six) hours as needed for wheezing or shortness of breath. 1 Inhaler 2  . b complex vitamins tablet Take 1 tablet by mouth daily.    Marland Kitchen BREO ELLIPTA 100-25 MCG/INH AEPB daily.    . cetirizine (ZYRTEC) 10 MG tablet Take 10 mg by mouth daily.    . cholecalciferol  (VITAMIN D) 1000 units tablet Take 2,000 Units by mouth every other day.     . conjugated estrogens (PREMARIN) vaginal cream 1/2 gram vaginally twice weekly or externally with a small amount daily 30 g 1  . cyclobenzaprine (FLEXERIL) 10 MG tablet Take 10 mg by mouth as needed for muscle spasms.    Marland Kitchen estradiol (VIVELLE-DOT) 0.05 MG/24HR patch PLACE 1 PATCH (0.05 MG TOTAL) ONTO THE SKIN 2 (TWO) TIMES A WEEK. 8 patch 6  . fluticasone (FLONASE) 50 MCG/ACT nasal spray 2 (two) times daily.    . Ibuprofen (ADVIL) 200 MG CAPS Take by mouth daily as needed.    Marland Kitchen LORazepam (ATIVAN) 1 MG tablet Take 1 mg by mouth at bedtime.     . Multiple Vitamins-Minerals (PRESERVISION AREDS 2 PO) Take by mouth daily.    . nicotine (NICODERM CQ - DOSED IN MG/24 HOURS) 14 mg/24hr patch Place 1 patch (14 mg total) onto the skin daily. 28 patch 0  . Omega-3 Fatty Acids (FISH OIL) 1000 MG CAPS Take by mouth.    Marland Kitchen omeprazole (PRILOSEC) 40 MG capsule Take 40 mg by mouth daily.    . polyethylene glycol powder (GLYCOLAX/MIRALAX) powder MIX 1 CAPFUL IN 8OZ OF WATER DAILY AS NEEDED FOR CONSTIPATION  5  . psyllium (METAMUCIL)  58.6 % packet Take 1 packet by mouth daily.    . Simethicone (GAS-X PO) Take by mouth.     No current facility-administered medications for this visit.     Family History  Problem Relation Age of Onset  . Hypertension Mother   . Other Mother        PAT-mother, sister, maternal grandmother  . COPD Mother   . Cancer Father        kidney  . Diabetes Father   . Kidney cancer Father   . Cancer Maternal Grandmother        liver  . Thyroid disease Maternal Grandmother   . Hypertension Maternal Grandmother   . Diabetes Maternal Grandmother   . Liver cancer Maternal Grandfather   . Cancer Paternal Uncle        pancreatic    Review of Systems  Constitutional:       Weight gain   HENT: Positive for congestion.   Genitourinary: Positive for frequency and urgency.       Loss of urine with cough or  sneeze   Neurological: Positive for speech change.  Endo/Heme/Allergies:       Craving sweets   All other systems reviewed and are negative.   Exam:   BP 102/72 (BP Location: Right Arm, Patient Position: Sitting, Cuff Size: Normal)   Pulse 72   Resp 14   Ht 5' 2.25" (1.581 m)   Wt 133 lb 3.2 oz (60.4 kg)   LMP 11/24/2010   BMI 24.17 kg/m    Height: 5' 2.25" (158.1 cm)  Ht Readings from Last 3 Encounters:  05/22/18 5' 2.25" (1.581 m)  11/26/17 5\' 2"  (1.575 m)  06/12/17 5\' 2"  (1.575 m)    General appearance: alert, cooperative and appears stated age Head: Normocephalic, without obvious abnormality, atraumatic Neck: no adenopathy, supple, symmetrical, trachea midline and thyroid normal to inspection and palpation Lungs: clear to auscultation bilaterally Breasts: normal appearance, no masses or tenderness Heart: regular rate and rhythm Abdomen: soft, non-tender; bowel sounds normal; no masses,  no organomegaly Extremities: extremities normal, atraumatic, no cyanosis or edema Skin: Skin color, texture, turgor normal. No rashes or lesions Lymph nodes: Cervical, supraclavicular, and axillary nodes normal. No abnormal inguinal nodes palpated Neurologic: Grossly normal   Pelvic: External genitalia:  no lesions              Urethra:  normal appearing urethra with no masses, tenderness or lesions              Bartholins and Skenes: normal                 Vagina: normal appearing vagina with normal color and discharge, no lesions              Cervix: absent              Pap taken: No. Bimanual Exam:  Uterus:  uterus absent              Adnexa: no mass, fullness, tenderness               Rectovaginal: Confirms               Anus:  normal sphincter tone, no lesions  Chaperone was present for exam.  A:  Well Woman with normal exam PMP, on HRT H/O LAVH 12/11 COPD H/O smoking, tried to stop smoking earlier this year Fibromyalgia   P:   Mammogram guidelines reviewed.  Aware this  is overdue and she needs this to continue with HRT pap smear not indicated Blood work UTD with PCP Declines Shingrix vaccination RF for Vivelle dot 0.05mg  patches twice weekly.  #24/0RF.  Will do additional RFs when gets results Colonoscopy is UTD return annually or prn

## 2018-06-25 ENCOUNTER — Ambulatory Visit: Payer: 59 | Admitting: Obstetrics & Gynecology

## 2018-07-23 ENCOUNTER — Other Ambulatory Visit: Payer: Self-pay | Admitting: Obstetrics & Gynecology

## 2018-07-23 DIAGNOSIS — Z1231 Encounter for screening mammogram for malignant neoplasm of breast: Secondary | ICD-10-CM

## 2018-08-16 ENCOUNTER — Other Ambulatory Visit: Payer: Self-pay | Admitting: Obstetrics & Gynecology

## 2018-08-16 NOTE — Telephone Encounter (Signed)
Called patient and patient states that she has enough patches to get to mammogram appointment on 08/19/18 Patient aware can be refilled after results received by Dr. Sabra Heck.

## 2018-08-19 ENCOUNTER — Ambulatory Visit
Admission: RE | Admit: 2018-08-19 | Discharge: 2018-08-19 | Disposition: A | Discharge status: Home / Self Care | Payer: BLUE CROSS/BLUE SHIELD | Source: Ambulatory Visit | Attending: Obstetrics & Gynecology | Admitting: Obstetrics & Gynecology

## 2018-08-19 DIAGNOSIS — Z1231 Encounter for screening mammogram for malignant neoplasm of breast: Secondary | ICD-10-CM

## 2018-08-22 ENCOUNTER — Other Ambulatory Visit: Payer: Self-pay | Admitting: Obstetrics & Gynecology

## 2018-08-22 MED ORDER — ESTRADIOL 0.05 MG/24HR TD PTTW: 1.0000 | MEDICATED_PATCH | TRANSDERMAL | 3 refills | Status: DC

## 2018-09-13 ENCOUNTER — Other Ambulatory Visit: Payer: Self-pay | Admitting: Obstetrics & Gynecology

## 2018-10-24 ENCOUNTER — Emergency Department (HOSPITAL_BASED_OUTPATIENT_CLINIC_OR_DEPARTMENT_OTHER)
Admission: EM | Admit: 2018-10-24 | Discharge: 2018-10-24 | Disposition: A | Discharge status: Home / Self Care | Payer: BLUE CROSS/BLUE SHIELD | Attending: Emergency Medicine | Admitting: Emergency Medicine

## 2018-10-24 ENCOUNTER — Emergency Department (HOSPITAL_BASED_OUTPATIENT_CLINIC_OR_DEPARTMENT_OTHER): Payer: BLUE CROSS/BLUE SHIELD

## 2018-10-24 ENCOUNTER — Other Ambulatory Visit: Payer: Self-pay

## 2018-10-24 ENCOUNTER — Encounter (HOSPITAL_BASED_OUTPATIENT_CLINIC_OR_DEPARTMENT_OTHER): Payer: Self-pay | Admitting: Emergency Medicine

## 2018-10-24 DIAGNOSIS — F17210 Nicotine dependence, cigarettes, uncomplicated: Secondary | ICD-10-CM | POA: Diagnosis not present

## 2018-10-24 DIAGNOSIS — R072 Precordial pain: Secondary | ICD-10-CM | POA: Insufficient documentation

## 2018-10-24 DIAGNOSIS — F419 Anxiety disorder, unspecified: Secondary | ICD-10-CM | POA: Insufficient documentation

## 2018-10-24 DIAGNOSIS — J449 Chronic obstructive pulmonary disease, unspecified: Secondary | ICD-10-CM | POA: Insufficient documentation

## 2018-10-24 DIAGNOSIS — R079 Chest pain, unspecified: Secondary | ICD-10-CM | POA: Diagnosis present

## 2018-10-24 LAB — CBC
HEMATOCRIT: 42.3 % (ref 36.0–46.0)
Hemoglobin: 13.9 g/dL (ref 12.0–15.0)
MCH: 31.2 pg (ref 26.0–34.0)
MCHC: 32.9 g/dL (ref 30.0–36.0)
MCV: 94.8 fL (ref 80.0–100.0)
Platelets: 231 10*3/uL (ref 150–400)
RBC: 4.46 MIL/uL (ref 3.87–5.11)
RDW: 11.9 % (ref 11.5–15.5)
WBC: 6.1 10*3/uL (ref 4.0–10.5)
nRBC: 0 % (ref 0.0–0.2)

## 2018-10-24 LAB — D-DIMER, QUANTITATIVE (NOT AT ARMC): D DIMER QUANT: 0.3 ug{FEU}/mL (ref 0.00–0.50)

## 2018-10-24 LAB — BASIC METABOLIC PANEL
ANION GAP: 10 (ref 5–15)
BUN: 9 mg/dL (ref 6–20)
CHLORIDE: 104 mmol/L (ref 98–111)
CO2: 26 mmol/L (ref 22–32)
CREATININE: 0.67 mg/dL (ref 0.44–1.00)
Calcium: 9.1 mg/dL (ref 8.9–10.3)
GFR calc non Af Amer: >60 mL/min (ref >60)
Glucose, Bld: 88 mg/dL (ref 70–99)
POTASSIUM: 3.7 mmol/L (ref 3.5–5.1)
SODIUM: 140 mmol/L (ref 135–145)

## 2018-10-24 LAB — TROPONIN I
Troponin I: <0.03 ng/mL (ref <0.03)
Troponin I: <0.03 ng/mL (ref <0.03)

## 2018-10-24 NOTE — ED Triage Notes (Signed)
Centralized chest pressure intermittently x 2-3 days with associated SOB, nausea and L shoulder pain. She went to her PCP yesterday and had a normal EKG

## 2018-10-24 NOTE — ED Provider Notes (Signed)
Emergency Department Provider Note   I have reviewed the triage vital signs and the nursing notes.   HISTORY  Chief Complaint Chest Pain   HPI Kim Morris is a 57 y.o. female with PMH of COPD, Fibromyalgia, and tobacco use presents to the emergency department with central, intermittent chest pressure over the past 2 to 3 days.  She reports some mild left shoulder pain with some dyspnea.  She states her dyspnea and shoulder/neck discomfort feels similar to her prior COPD and fibromyalgia symptoms respectively.  The chest pressure has actually been intermittent over the last several weeks but worsening over the past 2 to 3 days.  She did see her PCP yesterday and is in the process of obtaining a referral to outpatient cardiology.  When symptoms continued today she presented to the emergency department. No modifying factors. Denies pain currently. Some pain symptoms seem similar to GERD symptoms in the past.    Past Medical History:  Diagnosis Date  . Anxiety   . COPD (chronic obstructive pulmonary disease) ([place]) 02/2017  . Fibromyalgia   . GERD (gastroesophageal reflux disease)   . IBS (irritable bowel syndrome)   . PAT (paroxysmal atrial tachycardia) ([facility])    uses Rx PRN    Patient Active Problem List   Diagnosis Date Noted  . History of postmenopausal HRT 11/26/2017  . Tobacco abuse 11/26/2017  . COPD (chronic obstructive pulmonary disease) ([place]) 06/12/2017  . Temporomandibular joint (TMJ) pain 04/17/2017  . Fibromyalgia 10/09/2014    Past Surgical History:  Procedure Laterality Date  . CESAREAN SECTION    . GALLBLADDER SURGERY    . LAPAROSCOPIC VAGINAL HYSTERECTOMY  12/12/2010   ovaries remain  . TONSILLECTOMY AND ADENOIDECTOMY    . TUBAL LIGATION    . VAGINAL PROLAPSE REPAIR      Allergies Magnesium-containing compounds; Morphine and related; Silvadene [silver sulfadiazine]; and Sulfur  Family History  Problem Relation Age of Onset  . Hypertension Mother    . Other Mother        PAT-mother, sister, maternal grandmother  . COPD Mother   . Cancer Father        kidney  . Diabetes Father   . Kidney cancer Father   . Cancer Maternal Grandmother        liver  . Thyroid disease Maternal Grandmother   . Hypertension Maternal Grandmother   . Diabetes Maternal Grandmother   . Liver cancer Maternal Grandfather   . Cancer Paternal Uncle        pancreatic    Social History Social History   Tobacco Use  . Smoking status: Current Every Day Smoker    Packs/day: 1.00    Years: 36.00    Pack years: 36.00    Types: Cigarettes    Start date: 06/12/1981  . Smokeless tobacco: Never Used  . Tobacco comment: 3/4-1 PPD  Substance Use Topics  . Alcohol use: Yes    Comment: socially-3 per month  . Drug use: No    Review of Systems  Constitutional: No fever/chills Eyes: No visual changes. ENT: No sore throat. Cardiovascular: Positive intermittent chest pressure.  Respiratory: Denies shortness of breath. Gastrointestinal: No abdominal pain.  No nausea, no vomiting.  No diarrhea.  No constipation. Genitourinary: Negative for dysuria. Musculoskeletal: Positive left shoulder/neck pain.  Skin: Negative for rash. Neurological: Negative for headaches, focal weakness or numbness.  10-point ROS otherwise negative.  ____________________________________________   PHYSICAL EXAM:  VITAL SIGNS: ED Triage Vitals  Enc Vitals Group  BP 10/24/18 0919 (!) 141/73     Pulse Rate 10/24/18 0919 64     Resp 10/24/18 0919 16     Temp 10/24/18 0919 98.2 F (36.8 C)     Temp Source 10/24/18 0919 Oral     SpO2 10/24/18 0919 100 %     Weight 10/24/18 0918 135 lb (61.2 kg)     Height 10/24/18 0918 5\' 2"  (1.575 m)     Pain Score 10/24/18 0918 5   Constitutional: Alert and oriented. Well appearing and in no acute distress. Eyes: Conjunctivae are normal.  Head: Atraumatic. Nose: No congestion/rhinnorhea. Mouth/Throat: Mucous membranes are moist.   Oropharynx non-erythematous. Neck: No stridor.   Cardiovascular: Normal rate, regular rhythm. Good peripheral circulation. Grossly normal heart sounds.   Respiratory: Normal respiratory effort.  No retractions. Lungs CTAB. Gastrointestinal: Soft and nontender. No distention.  Musculoskeletal: No lower extremity tenderness nor edema. No gross deformities of extremities. Normal ROM of the neck and left shoulder. No bony tenderness.  Neurologic:  Normal speech and language. No gross focal neurologic deficits are appreciated.  Skin:  Skin is warm, dry and intact. No rash noted.  ____________________________________________   LABS (all labs ordered are listed, but only abnormal results are displayed)  Labs Reviewed  BASIC METABOLIC PANEL  CBC  TROPONIN I  D-DIMER, QUANTITATIVE (NOT AT [facility])  TROPONIN I   ____________________________________________  EKG   EKG Interpretation  Date/Time:  Thursday October 24 2018 09:18:38 EDT Ventricular Rate:  62 PR Interval:    QRS Duration: 89 QT Interval:  424 QTC Calculation: 431 R Axis:   88 Text Interpretation:  Sinus rhythm No STEMI.  Confirmed by Nanda Quinton [phone]) on 10/24/2018 9:31:24 AM       ____________________________________________  RADIOLOGY  Dg Chest 2 View  Result Date: 10/24/2018 CLINICAL DATA:  Chest pain, shortness of breath. EXAM: CHEST - 2 VIEW COMPARISON:  Radiographs of March 06, 2017. FINDINGS: The heart size and mediastinal contours are within normal limits. Both lungs are clear. No pneumothorax or pleural effusion is noted. Hyperinflation of the lungs is noted. The visualized skeletal structures are unremarkable. IMPRESSION: No active cardiopulmonary disease. Electronically Signed   By: Marijo Conception, M.D.   On: 10/24/2018 09:39    ____________________________________________   PROCEDURES  Procedure(s) performed:   Procedures  None ____________________________________________   INITIAL  IMPRESSION / ASSESSMENT AND PLAN / ED COURSE  Pertinent labs & imaging results that were available during my care of the patient were reviewed by me and considered in my medical decision making (see chart for details).  Patient presents to the emergency department with intermittent, central chest pressure which is been ongoing for several weeks but worsening over the past 2 to 3 days.  No active pain symptoms at this time.  EKG is unremarkable.  She is in the process of referral to an outpatient cardiology by her PCP.  Initial labs including troponin are normal.  My suspicion for DVT/PE is very low in this case. Patient low risk by [place] for PE but does use an estrogen patch. Will obtain ED d-dimer for further evaluation.   D-dimer and second troponin are negative. Plan for outpatient cardiology follow up.   At this time, I do not feel there is any life-threatening condition present. I have reviewed and discussed all results (EKG, imaging, lab, urine as appropriate), exam findings with patient. I have reviewed nursing notes and appropriate previous records.  I feel the patient is safe to  be discharged home without further emergent workup. Discussed usual and customary return precautions. Patient and family (if present) verbalize understanding and are comfortable with this plan.  Patient will follow-up with their primary care provider. If they do not have a primary care provider, information for follow-up has been provided to them. All questions have been answered.  ____________________________________________  FINAL CLINICAL IMPRESSION(S) / ED DIAGNOSES  Final diagnoses:  Precordial chest pain    Note:  This document was prepared using Dragon voice recognition software and may include unintentional dictation errors.  Nanda Quinton, MD Emergency Medicine    Edith Lord, [facility], MD 10/24/18 1710

## 2018-10-24 NOTE — Discharge Instructions (Signed)

## 2018-10-24 NOTE — ED Notes (Signed)
Given snack and soda  

## 2018-10-24 NOTE — ED Notes (Signed)
Patient transported to X-ray 

## 2018-11-05 ENCOUNTER — Ambulatory Visit (INDEPENDENT_AMBULATORY_CARE_PROVIDER_SITE_OTHER): Payer: BLUE CROSS/BLUE SHIELD | Admitting: Cardiovascular Disease

## 2018-11-05 ENCOUNTER — Encounter: Payer: Self-pay | Admitting: Cardiovascular Disease

## 2018-11-05 VITALS — BP 112/78 | HR 72 | Ht 62.0 in | Wt 138.6 lb

## 2018-11-05 DIAGNOSIS — R072 Precordial pain: Secondary | ICD-10-CM | POA: Diagnosis not present

## 2018-11-05 DIAGNOSIS — E7800 Pure hypercholesterolemia, unspecified: Secondary | ICD-10-CM | POA: Diagnosis not present

## 2018-11-05 DIAGNOSIS — Z720 Tobacco use: Secondary | ICD-10-CM | POA: Diagnosis not present

## 2018-11-05 DIAGNOSIS — Z01812 Encounter for preprocedural laboratory examination: Secondary | ICD-10-CM | POA: Diagnosis not present

## 2018-11-05 MED ORDER — METOPROLOL TARTRATE 50 MG PO TABS: ORAL_TABLET | ORAL | 0 refills | Status: DC

## 2018-11-05 NOTE — Progress Notes (Signed)
Cardiology Office Note   Date:  11/06/2018   ID:  Loistine, Eberlin Jun 21, 1961, MRN [id number]  PCP:  Holland Commons, FNP  Cardiologist:   Skeet Latch, MD   Chief Complaint  Patient presents with  . Chest Pain    chest pressure when moving around  . Shortness of Breath    exertion  . Dizziness    random  . Leg Pain    cramping legs at night.   . Edema    occasionally in ankles       History of Present Illness: CONCEPCION GILLOTT is a 57 y.o. female with tobacco abuse, COPD, fibromyalgia, hyperlipidemia, GERD, and HRT who is being seen today for the evaluation of chest pain at the request of Dr. Deland Pretty.  Ms. Hayden saw Dr. Shelia Media on 10/30 and reported episodes of chest pain and exertional intolerance.  She was seen in the ED 10/31 where cardiac enzymes and D-dimer were negative.  EKG was unremarkable.  Since then she was also seen in the [facility] ED where cardiac enzymes were also reportedly normal.  EKG was unremarkable.  She reports that she first started having symptoms 10/4 after getting a pneumonia and flu vaccine.  Since then she has had symptoms either every day or every other day.  She has 8 out of 10 substernal chest pressure that radiates into her neck.  It lasts for several hours independent of whether she is active or at rest.  However, she does notice that she is been more tired and short of breath with exertion.  She has increased diaphoresis, though not necessarily associated with these episodes and she attributes this to menopause.  The episodes are associated with shortness of breath and sometimes mild nausea.  Of note, she reports that her acid reflux has also been poorly controlled lately.  Her PCP had her increase her omeprazole and also start taking an aspirin.  She has not noted any lower extremity edema, orthopnea, or PND.  She does have some episodes of palpitations.  She has several family members with paroxysmal atrial tachycardia, and her  mother had an ablation.  Ms. Giammona has been smoking 1 pack of cigarettes daily for 30 years.  She is interested in trying to use patches again.  She already has some at home.  She has not been exercising and she notes that her diet has been poor.  Her appetite is limited.  Past Medical History:  Diagnosis Date  . Anxiety   . COPD (chronic obstructive pulmonary disease) ([place]) 02/2017  . Fibromyalgia   . GERD (gastroesophageal reflux disease)   . Hyperlipidemia 11/06/2018  . IBS (irritable bowel syndrome)   . PAT (paroxysmal atrial tachycardia) ([facility])    uses Rx PRN  . Precordial chest pain 11/06/2018    Past Surgical History:  Procedure Laterality Date  . CESAREAN SECTION    . GALLBLADDER SURGERY    . LAPAROSCOPIC VAGINAL HYSTERECTOMY  12/12/2010   ovaries remain  . TONSILLECTOMY AND ADENOIDECTOMY    . TUBAL LIGATION    . VAGINAL PROLAPSE REPAIR       Current Outpatient Medications  Medication Sig Dispense Refill  . albuterol (PROAIR HFA) 108 (90 Base) MCG/ACT inhaler Inhale 2 puffs into the lungs every 6 (six) hours as needed for wheezing or shortness of breath. 1 Inhaler 2  . aspirin EC 81 MG tablet Take 81 mg by mouth daily.    Marland Kitchen b complex vitamins tablet  Take 1 tablet by mouth daily.    Marland Kitchen BREO ELLIPTA 100-25 MCG/INH AEPB daily.    . cetirizine (ZYRTEC) 10 MG tablet Take 10 mg by mouth daily.    . cholecalciferol (VITAMIN D) 1000 units tablet Take 2,000 Units by mouth every other day.     . conjugated estrogens (PREMARIN) vaginal cream 1/2 gram vaginally twice weekly or externally with a small amount daily 30 g 1  . cyclobenzaprine (FLEXERIL) 10 MG tablet Take 10 mg by mouth as needed for muscle spasms.    Marland Kitchen estradiol (VIVELLE-DOT) 0.05 MG/24HR patch Place 1 patch (0.05 mg total) onto the skin 2 (two) times a week. 24 patch 3  . fluticasone (FLONASE) 50 MCG/ACT nasal spray 2 (two) times daily.    . Ibuprofen (ADVIL) 200 MG CAPS Take by mouth daily as needed.    Marland Kitchen  LORazepam (ATIVAN) 1 MG tablet Take 1 mg by mouth at bedtime.     . Multiple Vitamins-Minerals (PRESERVISION AREDS 2 PO) Take by mouth daily.    . nicotine (NICODERM CQ - DOSED IN MG/24 HOURS) 14 mg/24hr patch Place 1 patch (14 mg total) onto the skin daily. 28 patch 0  . Omega-3 Fatty Acids (FISH OIL) 1000 MG CAPS Take by mouth.    Marland Kitchen omeprazole (PRILOSEC) 40 MG capsule Take 40 mg by mouth daily.    . polyethylene glycol powder (GLYCOLAX/MIRALAX) powder MIX 1 CAPFUL IN 8OZ OF WATER DAILY AS NEEDED FOR CONSTIPATION  5  . psyllium (METAMUCIL) 58.6 % packet Take 1 packet by mouth daily.    . Simethicone (GAS-X PO) Take by mouth.    . metoprolol tartrate (LOPRESSOR) 50 MG tablet TAKE 1 TABLET 2 HOURS PRIOR TO CT 1 tablet 0   No current facility-administered medications for this visit.     Allergies:   Magnesium-containing compounds; Morphine and related; Silvadene [silver sulfadiazine]; and Sulfur    Social History:  The patient  reports that she has been smoking cigarettes. She started smoking about 37 years ago. She has a 36.00 pack-year smoking history. She has never used smokeless tobacco. She reports that she drinks alcohol. She reports that she does not use drugs.   Family History:  The patient's family history includes Asthma in her sister; COPD in her mother; Cancer in her maternal grandmother and paternal uncle; Diabetes in her father and maternal grandmother; Hypertension in her maternal grandmother and mother; Kidney cancer in her father; Liver cancer in her maternal grandfather; Melanoma in her sister; Other in her mother; Thyroid disease in her maternal grandmother.    ROS:  Please see the history of present illness.   Otherwise, review of systems are positive for none.   All other systems are reviewed and negative.    PHYSICAL EXAM: VS:  BP 112/78   Pulse 72   Ht 5\' 2"  (1.575 m)   Wt 138 lb 9.6 oz (62.9 kg)   LMP 11/24/2010   BMI 25.35 kg/m  , BMI Body mass index is 25.35  kg/m. GENERAL:  Well appearing HEENT:  Pupils equal round and reactive, fundi not visualized, oral mucosa unremarkable NECK:  No jugular venous distention, waveform within normal limits, carotid upstroke brisk and symmetric, no bruits LUNGS:  Clear to auscultation bilaterally HEART:  RRR.  PMI not displaced or sustained,S1 and S2 within normal limits, no S3, no S4, no clicks, no rubs, no murmurs ABD:  Flat, positive bowel sounds normal in frequency in pitch, no bruits, no rebound, no guarding,  no midline pulsatile mass, no hepatomegaly, no splenomegaly EXT:  2 plus pulses throughout, no edema, no cyanosis no clubbing SKIN:  No rashes no nodules NEURO:  Cranial nerves II through XII grossly intact, motor grossly intact throughout PSYCH:  Cognitively intact, oriented to person place and time   EKG:  EKG is not ordered today.   Recent Labs: 10/24/2018: BUN 9; Creatinine, Ser 0.67; Hemoglobin 13.9; Platelets 231; Potassium 3.7; Sodium 140   03/04/2018: Total cholesterol 208, triglycerides 60, HDL 60, LDL 136 TSH 1.66  Lipid Panel No results found for: CHOL, TRIG, HDL, CHOLHDL, VLDL, LDLCALC, LDLDIRECT    Wt Readings from Last 3 Encounters:  11/05/18 138 lb 9.6 oz (62.9 kg)  10/24/18 135 lb (61.2 kg)  05/22/18 133 lb 3.2 oz (60.4 kg)      ASSESSMENT AND PLAN:  # Chest pain: Ms. Netz's chest pain that occurs both at rest and with exertion.  She has an ongoing smoking history. We will get a Coronary CT-A to assess her coronaries.  # Hyperlipidemia: ASCVD 10 year risk 3.4%.  Coronary CT-A which will help determine her need for statin therapy.  # Tobacco abuse: She wants to try patches again and already has some at home.  Current medicines are reviewed at length with the patient today.  The patient does not have concerns regarding medicines.  The following changes have been made:  no change  Labs/ tests ordered today include:   Orders Placed This Encounter  Procedures  .  CT CORONARY MORPH W/CTA COR W/SCORE W/CA W/CM &/OR WO/CM  . CT CORONARY FRACTIONAL FLOW RESERVE DATA PREP  . CT CORONARY FRACTIONAL FLOW RESERVE FLUID ANALYSIS  . Basic metabolic panel     Disposition:   FU with Rhya Shan C. Oval Linsey, MD, [facility] in 1 month.     Signed, Kerilyn Cortner C. Oval Linsey, MD, [facility]  11/06/2018 8:41 PM    [place]

## 2018-11-05 NOTE — Patient Instructions (Addendum)
Medication Instructions:  TAKE METOPROLOL 50 MG  2 HOURS PRIOR TO CARDIAC CT  If you need a refill on your cardiac medications before your next appointment, please call your pharmacy.   Lab work: BMET 1 WEEK PRIOR TO CT If you have labs (blood work) drawn today and your tests are completely normal, you will receive your results only by: Marland Kitchen MyChart Message (if you have MyChart) OR . A paper copy in the mail If you have any lab test that is abnormal or we need to change your treatment, we will call you to review the results.  Testing/Procedures: Your physician has requested that you have cardiac CT. Cardiac computed tomography (CT) is a painless test that uses an x-ray machine to take clear, detailed pictures of your heart. For further information please visit [employer]uge[employer]iest[employer]tn. Please follow instruction sheet as given. THE OFFICE WILL CALL YOU TO SCHEDULE ONCE YOUR INSURANCE HAS APPROVED. IF YOU DO NOT HEAR FROM THE OFFICE IN 2 WEEKS CALL [phone]  Follow-Up: At [facility], you and your health needs are our priority.  As part of our continuing mission to provide you with exceptional heart care, we have created designated Provider Care Teams.  These Care Teams include your primary Cardiologist (physician) and Advanced Practice Providers (APPs -  Physician Assistants and Nurse Practitioners) who all work together to provide you with the care you need, when you need it. You will need a follow up appointment in 4 weeks.  Please call our office 2 months in advance to schedule this appointment.  You may see DR [facility]  or one of the following Advanced Practice Providers on your designated Care Team:   Kerin Ransom, PA-C Roby Lofts, [place] . Sande Rives, PA-C  Any Other Special Instructions Will Be Listed Below (If Applicable).  Please arrive at the [facility] main entrance of [facility] at xx:xx AM (30-45 minutes prior to test start time)  [facility] [place],  [id number] [phone]  Proceed to the [facility] Radiology Department (First Floor).  Please follow these instructions carefully (unless otherwise directed):  On the Night Before the Test: . Be sure to Drink plenty of water. . Do not consume any caffeinated/decaffeinated beverages or chocolate 12 hours prior to your test. . Do not take any antihistamines 12 hours prior to your test. . If you take Metformin do not take 24 hours prior to test. . If the patient has contrast allergy: ? Patient will need a prescription for Prednisone and very clear instructions (as follows): 1. Prednisone 50 mg - take 13 hours prior to test 2. Take another Prednisone 50 mg 7 hours prior to test 3. Take another Prednisone 50 mg 1 hour prior to test 4. Take Benadryl 50 mg 1 hour prior to test . Patient must complete all four doses of above prophylactic medications. . Patient will need a ride after test due to Benadryl.  On the Day of the Test: . Drink plenty of water. Do not drink any water within one hour of the test. . Do not eat any food 4 hours prior to the test. . You may take your regular medications prior to the test.  . Take metoprolol (Lopressor) two hours prior to test. . HOLD Furosemide/Hydrochlorothiazide morning of the test.      After the Test: . Drink plenty of water. . After receiving IV contrast, you may experience a mild flushed feeling. This is normal. . On occasion, you  may experience a mild rash up to 24 hours after the test. This is not dangerous. If this occurs, you can take Benadryl 25 mg and increase your fluid intake. . If you experience trouble breathing, this can be serious. If it is severe call 911 IMMEDIATELY. If it is mild, please call our office. . If you take any of these medications: Glipizide/Metformin, Avandament, Glucavance, please do not take 48 hours after completing test.   Cardiac CT Angiogram A cardiac CT angiogram is a procedure to  look at the heart and the area around the heart. It may be done to help find the cause of chest pains or other symptoms of heart disease. During this procedure, a large X-ray machine, called a CT scanner, takes detailed pictures of the heart and the surrounding area after a dye (contrast material) has been injected into blood vessels in the area. The procedure is also sometimes called a coronary CT angiogram, coronary artery scanning, or CTA. A cardiac CT angiogram allows the health care provider to see how well blood is flowing to and from the heart. The health care provider will be able to see if there are any problems, such as:  Blockage or narrowing of the coronary arteries in the heart.  Fluid around the heart.  Signs of weakness or disease in the muscles, valves, and tissues of the heart.  Tell a health care provider about:  Any allergies you have. This is especially important if you have had a previous allergic reaction to contrast dye.  All medicines you are taking, including vitamins, herbs, eye drops, creams, and over-the-counter medicines.  Any blood disorders you have.  Any surgeries you have had.  Any medical conditions you have.  Whether you are pregnant or may be pregnant.  Any anxiety disorders, chronic pain, or other conditions you have that may increase your stress or prevent you from lying still. What are the risks? Generally, this is a safe procedure. However, problems may occur, including:  Bleeding.  Infection.  Allergic reactions to medicines or dyes.  Damage to other structures or organs.  Kidney damage from the dye or contrast that is used.  Increased risk of cancer from radiation exposure. This risk is low. Talk with your health care provider about: ? The risks and benefits of testing. ? How you can receive the lowest dose of radiation.  What happens before the procedure?  Wear comfortable clothing and remove any jewelry, glasses, dentures, and  hearing aids.  Follow instructions from your health care provider about eating and drinking. This may include: ? For 12 hours before the test - avoid caffeine. This includes tea, coffee, soda, energy drinks, and diet pills. Drink plenty of water or other fluids that do not have caffeine in them. Being well-hydrated can prevent complications. ? For 4-6 hours before the test - stop eating and drinking. The contrast dye can cause nausea, but this is less likely if your stomach is empty.  Ask your health care provider about changing or stopping your regular medicines. This is especially important if you are taking diabetes medicines, blood thinners, or medicines to treat erectile dysfunction. What happens during the procedure?  Hair on your chest may need to be removed so that small sticky patches called electrodes can be placed on your chest. These will transmit information that helps to monitor your heart during the test.  An IV tube will be inserted into one of your veins.  You might be given a medicine to  control your heart rate during the test. This will help to ensure that good images are obtained.  You will be asked to lie on an exam table. This table will slide in and out of the CT machine during the procedure.  Contrast dye will be injected into the IV tube. You might feel warm, or you may get a metallic taste in your mouth.  You will be given a medicine (nitroglycerin) to relax (dilate) the arteries in your heart.  The table that you are lying on will move into the CT machine tunnel for the scan.  The person running the machine will give you instructions while the scans are being done. You may be asked to: ? Keep your arms above your head. ? Hold your breath. ? Stay very still, even if the table is moving.  When the scanning is complete, you will be moved out of the machine.  The IV tube will be removed. The procedure may vary among health care providers and hospitals. What  happens after the procedure?  You might feel warm, or you may get a metallic taste in your mouth from the contrast dye.  You may have a headache from the nitroglycerin.  After the procedure, drink water or other fluids to wash (flush) the contrast material out of your body.  Contact a health care provider if you have any symptoms of allergy to the contrast. These symptoms include: ? Shortness of breath. ? Rash or hives. ? A racing heartbeat.  Most people can return to their normal activities right after the procedure. Ask your health care provider what activities are safe for you.  It is up to you to get the results of your procedure. Ask your health care provider, or the department that is doing the procedure, when your results will be ready. Summary  A cardiac CT angiogram is a procedure to look at the heart and the area around the heart. It may be done to help find the cause of chest pains or other symptoms of heart disease.  During this procedure, a large X-ray machine, called a CT scanner, takes detailed pictures of the heart and the surrounding area after a dye (contrast material) has been injected into blood vessels in the area.  Ask your health care provider about changing or stopping your regular medicines before the procedure. This is especially important if you are taking diabetes medicines, blood thinners, or medicines to treat erectile dysfunction.  After the procedure, drink water or other fluids to wash (flush) the contrast material out of your body. This information is not intended to replace advice given to you by your health care provider. Make sure you discuss any questions you have with your health care provider. Document Released: 11/23/2008 Document Revised: 10/30/2016 Document Reviewed: 10/30/2016 Elsevier Interactive Patient Education  2017 [employer].

## 2018-11-06 ENCOUNTER — Encounter: Payer: Self-pay | Admitting: Cardiovascular Disease

## 2018-11-06 DIAGNOSIS — R072 Precordial pain: Secondary | ICD-10-CM

## 2018-11-06 DIAGNOSIS — E785 Hyperlipidemia, unspecified: Secondary | ICD-10-CM

## 2018-11-06 HISTORY — DX: Precordial pain: R07.2

## 2018-11-06 HISTORY — DX: Hyperlipidemia, unspecified: E78.5

## 2018-12-02 ENCOUNTER — Encounter: Payer: Self-pay | Admitting: Pulmonary Disease

## 2018-12-02 ENCOUNTER — Ambulatory Visit: Payer: BLUE CROSS/BLUE SHIELD | Admitting: Pulmonary Disease

## 2018-12-02 VITALS — BP 120/70 | HR 68 | Ht 62.0 in | Wt 137.2 lb

## 2018-12-02 DIAGNOSIS — J439 Emphysema, unspecified: Secondary | ICD-10-CM | POA: Diagnosis not present

## 2018-12-02 DIAGNOSIS — Z720 Tobacco use: Secondary | ICD-10-CM

## 2018-12-02 NOTE — Assessment & Plan Note (Signed)
set a quit date of January 1. Get started on nicotine patch 14-21 milligrams daily

## 2018-12-02 NOTE — Assessment & Plan Note (Signed)
Based on hyperinflation on chest x-ray do feel that she has a degree of emphysema. She feels symptomatically improved on Breo but really emphasis on smoking cessation here  Lung function seems to be maintained over the past year, we discussed signs and symptoms of an exacerbation

## 2018-12-02 NOTE — Patient Instructions (Addendum)
You set a quit date of January 1. Get started on nicotine patch 14-21 milligrams daily  Call us if you develop yellow-green sputum or increased wheezing

## 2018-12-02 NOTE — Progress Notes (Signed)
   Subjective:    Patient ID: Kim Morris, female    DOB: 1961/02/05, 57 y.o.   MRN: [id number]  HPI  57 yo female smoker followed for COPD   She continues to smoke about a pack per day, attributes this to stress about her dog , daughter's moving, at work and time of the year. She has intermittent dyspnea especially on walking the dog or climbing stairs.  She developed substernal chest pain and underwent cardiology evaluation in the coronary CTs plan, chest x-ray 10/31 was reviewed which shows hyperinflation consistent with emphysema  She is compliant with Breo and very occasionally has to take albuterol for rescue.  Denies nocturnal wheezing no obvious heartburn or meal related pains  Significant tests/ events reviewed  Spirometry 05/2017 >> ratio 75, FEV1 of 69% and FVC of 72%- more suggestive of mild restriction  Spirometry 11/2018 ratio 81, FEV1 76%, FVC 73%  Review of Systems neg for any significant sore throat, dysphagia, itching, sneezing, nasal congestion or excess/ purulent secretions, fever, chills, sweats, unintended wt loss, pleuritic or exertional cp, hempoptysis, orthopnea pnd or change in chronic leg swelling. Also denies presyncope, palpitations, heartburn, abdominal pain, nausea, vomiting, diarrhea or change in bowel or urinary habits, dysuria,hematuria, rash, arthralgias, visual complaints, headache, numbness weakness or ataxia.     Objective:   Physical Exam  Gen. Pleasant, well-nourished, in no distress ENT - no thrush, no post nasal drip Neck: No JVD, no thyromegaly, no carotid bruits Lungs: no use of accessory muscles, no dullness to percussion, decreased without rales or rhonchi  Cardiovascular: Rhythm regular, heart sounds  normal, no murmurs or gallops, no peripheral edema Musculoskeletal: No deformities, no cyanosis or clubbing         Assessment & Plan:

## 2018-12-03 ENCOUNTER — Ambulatory Visit: Payer: BLUE CROSS/BLUE SHIELD | Admitting: Cardiology

## 2018-12-30 ENCOUNTER — Telehealth: Payer: Self-pay | Admitting: Cardiovascular Disease

## 2018-12-30 NOTE — Telephone Encounter (Signed)
Did not need this encounter °

## 2018-12-31 ENCOUNTER — Ambulatory Visit (HOSPITAL_COMMUNITY): Payer: BLUE CROSS/BLUE SHIELD

## 2019-01-03 ENCOUNTER — Telehealth: Payer: Self-pay | Admitting: *Deleted

## 2019-01-03 NOTE — Telephone Encounter (Signed)
Patient cancelled her 1 month follow up and Cardiac CT, will call back an schedule. Patient seen in office 11/05/18

## 2019-01-21 ENCOUNTER — Telehealth: Payer: Self-pay | Admitting: Cardiovascular Disease

## 2019-01-21 NOTE — Telephone Encounter (Signed)
Spoke with patient and she has been having intermittent chest pain since Saturday. They are sharp pains in left breast that do not last long. When asked if she has any radiating pain she stated she has fibromyalgia and hurts all the time but left shoulder has been hurting. Scheduled visit with Arnold Long DNP for tomorrow. Advised to take it easy and if chest pains return and do not go away to call 911, patient verbalized understanding.

## 2019-01-21 NOTE — Telephone Encounter (Signed)
Call to schedule CT that she had cancel on 12-31-18.   She has been having cp x few days on and off.   Explain that we need to send a message to discuss what is going on.   Next opening for CP not til  Feb.

## 2019-01-22 ENCOUNTER — Encounter: Payer: Self-pay | Admitting: Adult Health

## 2019-01-22 ENCOUNTER — Ambulatory Visit (INDEPENDENT_AMBULATORY_CARE_PROVIDER_SITE_OTHER): Payer: BLUE CROSS/BLUE SHIELD | Admitting: Adult Health

## 2019-01-22 VITALS — BP 138/75 | HR 67 | Ht 62.0 in | Wt 135.0 lb

## 2019-01-22 DIAGNOSIS — Z720 Tobacco use: Secondary | ICD-10-CM

## 2019-01-22 DIAGNOSIS — Z79899 Other long term (current) drug therapy: Secondary | ICD-10-CM | POA: Diagnosis not present

## 2019-01-22 DIAGNOSIS — R079 Chest pain, unspecified: Secondary | ICD-10-CM

## 2019-01-22 MED ORDER — NITROGLYCERIN 0.4 MG SL SUBL: 0.4000 mg | SUBLINGUAL_TABLET | SUBLINGUAL | 3 refills | Status: DC | PRN

## 2019-01-22 NOTE — Progress Notes (Signed)
Cardiology Office Note   Date:  01/22/2019   ID:  Kim Morris, Kim Morris 05-26-1961, MRN [id number]  PCP:  Holland Commons, FNP  Cardiologist:  [place]  Chief Complaint  Patient presents with  . Follow-up  . Chest Pain     History of Present Illness: Kim Morris is a 58 y.o. female who presents for ongoing assessment and management of chest pain, with other history of COPD, fibromyalgia, HL, GERD, and ongoing tobacco abuse. She called our office on 01/21/2019 for recurrent chest pain and requested an appointment. She was scheduled for a cardiac CTA. She has not had this scheduled yet.   She comes today with more episodes of chest pain, described as sharp with radiation into the left axilla, shoulder. She has some associated dyspnea, and rapid HR She admits that she is uncertain if this is related to gas, fibromyalgia, or IBS symptoms. She is due to see GI in the am for recommendations. She has been having abdominal pain and cramping as well.   Past Medical History:  Diagnosis Date  . Anxiety   . COPD (chronic obstructive pulmonary disease) ([place]) 02/2017  . Fibromyalgia   . GERD (gastroesophageal reflux disease)   . Hyperlipidemia 11/06/2018  . IBS (irritable bowel syndrome)   . PAT (paroxysmal atrial tachycardia) ([facility])    uses Rx PRN  . Precordial chest pain 11/06/2018    Past Surgical History:  Procedure Laterality Date  . CESAREAN SECTION    . GALLBLADDER SURGERY    . LAPAROSCOPIC VAGINAL HYSTERECTOMY  12/12/2010   ovaries remain  . TONSILLECTOMY AND ADENOIDECTOMY    . TUBAL LIGATION    . VAGINAL PROLAPSE REPAIR       Current Outpatient Medications  Medication Sig Dispense Refill  . albuterol (PROAIR HFA) 108 (90 Base) MCG/ACT inhaler Inhale 2 puffs into the lungs every 6 (six) hours as needed for wheezing or shortness of breath. 1 Inhaler 2  . aspirin EC 81 MG tablet Take 81 mg by mouth daily.    Marland Kitchen b complex vitamins tablet Take 1 tablet by mouth daily.    Marland Kitchen  BREO ELLIPTA 100-25 MCG/INH AEPB daily.    . cholecalciferol (VITAMIN D) 1000 units tablet Take 2,000 Units by mouth every other day.     . conjugated estrogens (PREMARIN) vaginal cream 1/2 gram vaginally twice weekly or externally with a small amount daily 30 g 1  . cyclobenzaprine (FLEXERIL) 10 MG tablet Take 10 mg by mouth as needed for muscle spasms.    Marland Kitchen estradiol (VIVELLE-DOT) 0.05 MG/24HR patch Place 1 patch (0.05 mg total) onto the skin 2 (two) times a week. 24 patch 3  . fluticasone (FLONASE) 50 MCG/ACT nasal spray 2 (two) times daily.    . Ibuprofen (ADVIL) 200 MG CAPS Take by mouth daily as needed.    Marland Kitchen LORazepam (ATIVAN) 1 MG tablet Take 1 mg by mouth at bedtime.     . metoprolol tartrate (LOPRESSOR) 50 MG tablet TAKE 1 TABLET 2 HOURS PRIOR TO CT 1 tablet 0  . Multiple Vitamins-Minerals (PRESERVISION AREDS 2 PO) Take by mouth daily.    . Omega-3 Fatty Acids (FISH OIL) 1000 MG CAPS Take by mouth.    Marland Kitchen omeprazole (PRILOSEC) 40 MG capsule Take 40 mg by mouth daily.    . polyethylene glycol powder (GLYCOLAX/MIRALAX) powder MIX 1 CAPFUL IN 8OZ OF WATER DAILY AS NEEDED FOR CONSTIPATION  5  . psyllium (METAMUCIL) 58.6 % packet Take 1 packet by mouth  daily.    . Simethicone (GAS-X PO) Take by mouth.    . nitroGLYCERIN (NITROSTAT) 0.4 MG SL tablet Place 1 tablet (0.4 mg total) under the tongue every 5 (five) minutes as needed for chest pain. 90 tablet 3   No current facility-administered medications for this visit.     Allergies:   Magnesium-containing compounds; Morphine and related; Silvadene [silver sulfadiazine]; and Sulfur    Social History:  The patient  reports that she has been smoking cigarettes. She started smoking about 37 years ago. She has a 36.00 pack-year smoking history. She has never used smokeless tobacco. She reports current alcohol use. She reports that she does not use drugs.   Family History:  The patient's family history includes Asthma in her sister; COPD in her  mother; Cancer in her maternal grandmother and paternal uncle; Diabetes in her father and maternal grandmother; Hypertension in her maternal grandmother and mother; Kidney cancer in her father; Liver cancer in her maternal grandfather; Melanoma in her sister; Other in her mother; Thyroid disease in her maternal grandmother.    ROS: All other systems are reviewed and negative. Unless otherwise mentioned in H&P    PHYSICAL EXAM: VS:  BP 138/75   Pulse 67   Ht 5\' 2"  (1.575 m)   Wt 135 lb (61.2 kg)   LMP 11/24/2010   BMI 24.69 kg/m  , BMI Body mass index is 24.69 kg/m. GEN: Well nourished, well developed, in no acute distress HEENT: normal Neck: no JVD, carotid bruits, or masses Cardiac: RRR; no murmurs, rubs, or gallops,no edema  Respiratory:  Clear to auscultation bilaterally, normal work of breathing GI: soft, nontender, nondistended, + BS MS: no deformity or atrophy Skin: warm and dry, no rash Neuro:  Strength and sensation are intact Psych: euthymic mood, full affect   EKG: NSR rate of 67 bpm   Recent Labs: 10/24/2018: BUN 9; Creatinine, Ser 0.67; Hemoglobin 13.9; Platelets 231; Potassium 3.7; Sodium 140    Lipid Panel No results found for: CHOL, TRIG, HDL, CHOLHDL, VLDL, LDLCALC, LDLDIRECT    Wt Readings from Last 3 Encounters:  01/22/19 135 lb (61.2 kg)  12/02/18 137 lb 3.2 oz (62.2 kg)  11/05/18 138 lb 9.6 oz (62.9 kg)      Other studies Reviewed: None  ASSESSMENT AND PLAN:  1. Chest pain: Atypical in description with sharp pain under her left breast, with radiation into the axilla, occurring with and without exertion. She is in agreement to have cardiac CTA for diagnostic/prognostic purposes. She already has a Rx for metoprolol that was ordered on last office visit for planned CTA. I will give her a Rx for NTG sublingual in case this is related to spasm as well. She is advised not to take this while driving as she will have a drop in her BP with this medication.     2.Ongoing tobacco abuse: She is counseled on cessation.    Current medicines are reviewed at length with the patient today.    Labs/ tests ordered today include: Cardiac CTA  Phill Myron. West Pugh, ANP, AACC   01/22/2019 3:28 PM    [facility] [address] Office [phone] Fax [phone]

## 2019-01-22 NOTE — Patient Instructions (Signed)
Medication Instructions:  NO CHANGES- Your physician recommends that you continue on your current medications as directed. Please refer to the Current Medication list given to you today. If you need a refill on your cardiac medications before your next appointment, please call your pharmacy.  Labwork: BMET,CBC AND MAG TODAY HERE IN OUR OFFICE AT [facility]  Take the provided lab slips with you to the lab for your blood draw.   When you have your labs (blood work) drawn today and your tests are completely normal, you will receive your results only by MyChart Message (if you have MyChart) -OR-  A paper copy in the mail.  If you have any lab test that is abnormal or we need to change your treatment, we will call you to review these results.  Testing/Procedures: SCHEDULE  CT W/FFR   Follow-Up: You will need a follow up appointment in AFTER CT.   You may see Skeet Latch, MD Jory Sims, DNP, AACC  or one of the following Advanced Practice Providers on your designated Care Team: Kerin Ransom, PA-C  Roby Lofts, PA-C  Sande Rives, [place]   At [facility], you and your health needs are our priority.  As part of our continuing mission to provide you with exceptional heart care, we have created designated Provider Care Teams.  These Care Teams include your primary Cardiologist (physician) and Advanced Practice Providers (APPs -  Physician Assistants and Nurse Practitioners) who all work together to provide you with the care you need, when you need it.  Thank you for choosing [facility] at [facility]!!

## 2019-01-23 LAB — CBC
HEMATOCRIT: 41.8 % (ref 34.0–46.6)
HEMOGLOBIN: 14.1 g/dL (ref 11.1–15.9)
MCH: 30.6 pg (ref 26.6–33.0)
MCHC: 33.7 g/dL (ref 31.5–35.7)
MCV: 91 fL (ref 79–97)
Platelets: 248 10*3/uL (ref 150–450)
RBC: 4.61 x10E6/uL (ref 3.77–5.28)
RDW: 11.7 % (ref 11.7–15.4)
WBC: 7.2 10*3/uL (ref 3.4–10.8)

## 2019-01-23 LAB — BASIC METABOLIC PANEL
BUN/Creatinine Ratio: 11 (ref 9–23)
BUN: 7 mg/dL (ref 6–24)
CALCIUM: 9.3 mg/dL (ref 8.7–10.2)
CO2: 25 mmol/L (ref 20–29)
CREATININE: 0.65 mg/dL (ref 0.57–1.00)
Chloride: 100 mmol/L (ref 96–106)
GFR, EST AFRICAN AMERICAN: 116 mL/min/{1.73_m2} (ref >59)
GFR, EST NON AFRICAN AMERICAN: 100 mL/min/{1.73_m2} (ref >59)
Glucose: 75 mg/dL (ref 65–99)
Potassium: 4.3 mmol/L (ref 3.5–5.2)
Sodium: 138 mmol/L (ref 134–144)

## 2019-01-23 LAB — MAGNESIUM: MAGNESIUM: 2.1 mg/dL (ref 1.6–2.3)

## 2019-02-03 ENCOUNTER — Telehealth (HOSPITAL_COMMUNITY): Payer: Self-pay | Admitting: Emergency Medicine

## 2019-02-03 NOTE — Telephone Encounter (Signed)
Left message on voicemail with name and callback number Tobin Witucki RN Navigator Cardiac Imaging [facility] [phone] Office [phone] Cell  

## 2019-02-05 ENCOUNTER — Ambulatory Visit (HOSPITAL_COMMUNITY): Admission: RE | Admit: 2019-02-05 | Payer: BLUE CROSS/BLUE SHIELD | Source: Ambulatory Visit

## 2019-02-05 ENCOUNTER — Ambulatory Visit (HOSPITAL_COMMUNITY)
Admission: RE | Admit: 2019-02-05 | Discharge: 2019-02-05 | Disposition: A | Discharge status: Home / Self Care | Payer: BLUE CROSS/BLUE SHIELD | Source: Ambulatory Visit | Attending: Cardiovascular Disease | Admitting: Cardiovascular Disease

## 2019-02-05 DIAGNOSIS — R072 Precordial pain: Secondary | ICD-10-CM | POA: Insufficient documentation

## 2019-02-05 MED ORDER — NITROGLYCERIN 0.4 MG SL SUBL
0.8000 mg | SUBLINGUAL_TABLET | Freq: Once | SUBLINGUAL | Status: AC
  Administered 2019-02-05: 0.8 mg via SUBLINGUAL
  Filled 2019-02-05: qty 25

## 2019-02-05 MED ORDER — IOPAMIDOL (ISOVUE-370) INJECTION 76%
80.0000 mL | Freq: Once | INTRAVENOUS | Status: AC | PRN
  Administered 2019-02-05: 80 mL via INTRAVENOUS

## 2019-02-05 MED ORDER — NITROGLYCERIN 0.4 MG SL SUBL
SUBLINGUAL_TABLET | SUBLINGUAL | Status: AC
  Filled 2019-02-05: qty 2

## 2019-02-17 ENCOUNTER — Ambulatory Visit: Payer: BLUE CROSS/BLUE SHIELD | Admitting: Adult Health

## 2019-03-04 ENCOUNTER — Other Ambulatory Visit: Payer: Self-pay | Admitting: Adult Health

## 2019-03-13 ENCOUNTER — Telehealth: Payer: Self-pay | Admitting: *Deleted

## 2019-03-13 DIAGNOSIS — E7800 Pure hypercholesterolemia, unspecified: Secondary | ICD-10-CM

## 2019-03-13 NOTE — Telephone Encounter (Signed)
Patient is going to work on diet and exercise for cholesterol and recheck LP/CMET in 3 months. My chart message sent and will mail orders.

## 2019-03-29 ENCOUNTER — Other Ambulatory Visit: Payer: Self-pay | Admitting: Adult Health

## 2019-08-21 ENCOUNTER — Other Ambulatory Visit: Payer: Self-pay

## 2019-08-22 ENCOUNTER — Encounter: Payer: Self-pay | Admitting: Obstetrics & Gynecology

## 2019-08-22 ENCOUNTER — Ambulatory Visit (INDEPENDENT_AMBULATORY_CARE_PROVIDER_SITE_OTHER): Payer: BLUE CROSS/BLUE SHIELD | Admitting: Obstetrics & Gynecology

## 2019-08-22 VITALS — BP 112/76 | HR 72 | Temp 97.6°F | Ht 61.5 in | Wt 134.0 lb

## 2019-08-22 DIAGNOSIS — Z01419 Encounter for gynecological examination (general) (routine) without abnormal findings: Secondary | ICD-10-CM | POA: Diagnosis not present

## 2019-08-22 MED ORDER — ESTRADIOL 0.05 MG/24HR TD PTTW: 1.0000 | MEDICATED_PATCH | TRANSDERMAL | 1 refills | Status: DC

## 2019-08-22 NOTE — Progress Notes (Signed)
58 y.o. [id number] Married White or Caucasian female here for annual exam.  Denies vaginal bleeding.  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  Mother-in-law died earlier their year.  Daughter's husband is stationed at [employer].    Patient's last menstrual period was 11/24/2010.          Sexually active: Yes.    The current method of family planning is status post hysterectomy.    Exercising: No.   Smoker:  yes  Health Maintenance: Pap:  2011  History of abnormal Pap:  yes MMG:  08/19/18 BIRADS1:neg.  She has this scheduled.   Colonoscopy:  2015 f/u 10 years  BMD:   Never TDaP:  03/30/17 Pneumonia vaccine(s):  2019 Shingrix:   No Hep C testing: done  Screening Labs: PCP   reports that she has been smoking cigarettes. She started smoking about 38 years ago. She has a 36.00 pack-year smoking history. She has never used smokeless tobacco. She reports current alcohol use of about 2.0 - 3.0 standard drinks of alcohol per week. She reports that she does not use drugs.  Past Medical History:  Diagnosis Date  . Anxiety   . COPD (chronic obstructive pulmonary disease) ([place]) 02/2017  . Fibromyalgia   . GERD (gastroesophageal reflux disease)   . Hyperlipidemia 11/06/2018  . IBS (irritable bowel syndrome)   . Neuroma of right leg   . PAT (paroxysmal atrial tachycardia) ([facility])    uses Rx PRN  . Precordial chest pain 11/06/2018    Past Surgical History:  Procedure Laterality Date  . CESAREAN SECTION    . GALLBLADDER SURGERY    . LAPAROSCOPIC VAGINAL HYSTERECTOMY  12/12/2010   ovaries remain  . TONSILLECTOMY AND ADENOIDECTOMY    . TUBAL LIGATION    . VAGINAL PROLAPSE REPAIR      Current Outpatient Medications  Medication Sig Dispense Refill  . albuterol (PROVENTIL HFA;VENTOLIN HFA) 108 (90 Base) MCG/ACT inhaler TAKE 2 PUFFS BY MOUTH EVERY 6 HOURS AS NEEDED FOR WHEEZE OR SHORTNESS OF BREATH 8.5 g 5  . b complex vitamins tablet Take 1 tablet by mouth daily.    Marland Kitchen BREO  ELLIPTA 100-25 MCG/INH AEPB daily.    . cetirizine (ZYRTEC) 10 MG tablet Take 10 mg by mouth daily.    . cholecalciferol (VITAMIN D) 1000 units tablet Take 2,000 Units by mouth every other day.     . estradiol (VIVELLE-DOT) 0.05 MG/24HR patch Place 1 patch (0.05 mg total) onto the skin 2 (two) times a week. 24 patch 3  . fluticasone (FLONASE) 50 MCG/ACT nasal spray 2 (two) times daily.    Marland Kitchen LORazepam (ATIVAN) 1 MG tablet Take 1 mg by mouth at bedtime.     . Multiple Vitamins-Minerals (PRESERVISION AREDS 2 PO) Take by mouth daily.    . naproxen (NAPROSYN) 500 MG tablet Take 500 mg by mouth 2 (two) times daily.    Marland Kitchen omeprazole (PRILOSEC) 40 MG capsule Take 40 mg by mouth daily.    . polyethylene glycol powder (GLYCOLAX/MIRALAX) powder MIX 1 CAPFUL IN 8OZ OF WATER DAILY AS NEEDED FOR CONSTIPATION  5  . psyllium (METAMUCIL) 58.6 % packet Take 1 packet by mouth daily.    . Simethicone (GAS-X PO) Take by mouth.    . vitamin C (ASCORBIC ACID) 500 MG tablet Take 500 mg by mouth daily.    . nitroGLYCERIN (NITROSTAT) 0.4 MG SL tablet Place 1 tablet (0.4 mg total) under the tongue every 5 (five) minutes as needed  for chest pain. 90 tablet 3   No current facility-administered medications for this visit.     Family History  Problem Relation Age of Onset  . Hypertension Mother   . Other Mother        PAT-mother, sister, maternal grandmother  . COPD Mother   . Diabetes Father   . Kidney cancer Father   . Cancer Maternal Grandmother        liver  . Thyroid disease Maternal Grandmother   . Hypertension Maternal Grandmother   . Diabetes Maternal Grandmother   . Liver cancer Maternal Grandfather   . Cancer Paternal Uncle        pancreatic  . Asthma Sister   . Melanoma Sister     Review of Systems  All other systems reviewed and are negative.   Exam:   BP 112/76   Pulse 72   Temp 97.6 F (36.4 C) (Temporal)   Ht 5' 1.5" (1.562 m)   Wt 134 lb (60.8 kg)   LMP 11/24/2010   BMI 24.91 kg/m    Height:   Height: 5' 1.5" (156.2 cm)  Ht Readings from Last 3 Encounters:  08/22/19 5' 1.5" (1.562 m)  01/22/19 5\' 2"  (1.575 m)  12/02/18 5\' 2"  (1.575 m)    General appearance: alert, cooperative and appears stated age Head: Normocephalic, without obvious abnormality, atraumatic Neck: no adenopathy, supple, symmetrical, trachea midline and thyroid normal to inspection and palpation Lungs: clear to auscultation bilaterally Breasts: normal appearance, no masses or tenderness Heart: regular rate and rhythm Abdomen: soft, non-tender; bowel sounds normal; no masses,  no organomegaly Extremities: extremities normal, atraumatic, no cyanosis or edema Skin: Skin color, texture, turgor normal. No rashes or lesions Lymph nodes: Cervical, supraclavicular, and axillary nodes normal. No abnormal inguinal nodes palpated Neurologic: Grossly normal   Pelvic: External genitalia:  no lesions              Urethra:  normal appearing urethra with no masses, tenderness or lesions              Bartholins and Skenes: normal                 Vagina: normal appearing vagina with normal color and discharge, no lesions              Cervix: absent              Pap taken: No. Bimanual Exam:  Uterus:  uterus absent              Adnexa: normal adnexa and no mass, fullness, tenderness               Rectovaginal: Confirms               Anus:  normal sphincter tone, no lesions  Chaperone was present for exam.  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  She is aware this is due. pap smear not indicated Lab work UTD Declines Shingrix vaccination RF for vivelle dot 0.05mg  patches twice weekly.  #24/1RF Colonoscopy due in 2025 Return annually or prn

## 2019-12-11 ENCOUNTER — Other Ambulatory Visit: Payer: Self-pay | Admitting: Obstetrics & Gynecology

## 2019-12-11 DIAGNOSIS — Z1231 Encounter for screening mammogram for malignant neoplasm of breast: Secondary | ICD-10-CM

## 2019-12-11 NOTE — Telephone Encounter (Signed)
Patient checking status of refill request. °

## 2019-12-11 NOTE — Telephone Encounter (Signed)
Spoke to pt. Pt aware of having 1 more RF at [facility] and will have phone call [facility] from [facility].   Will route to Dr Sabra Heck for review and will close encounter.

## 2019-12-12 NOTE — Telephone Encounter (Signed)
Just FYI.  I am not refilling her HRT past this current RF until she has her MMG.  It is not scheduled until 01/2020.

## 2019-12-30 IMAGING — DX DG CHEST 2V
2 series · 2 of 2 positions shown · non-contrast
Comparison: Radiographs March 06, 2017.

CLINICAL DATA: Chest pain, shortness of breath.

EXAM:
CHEST - 2 VIEW

[chest pa]
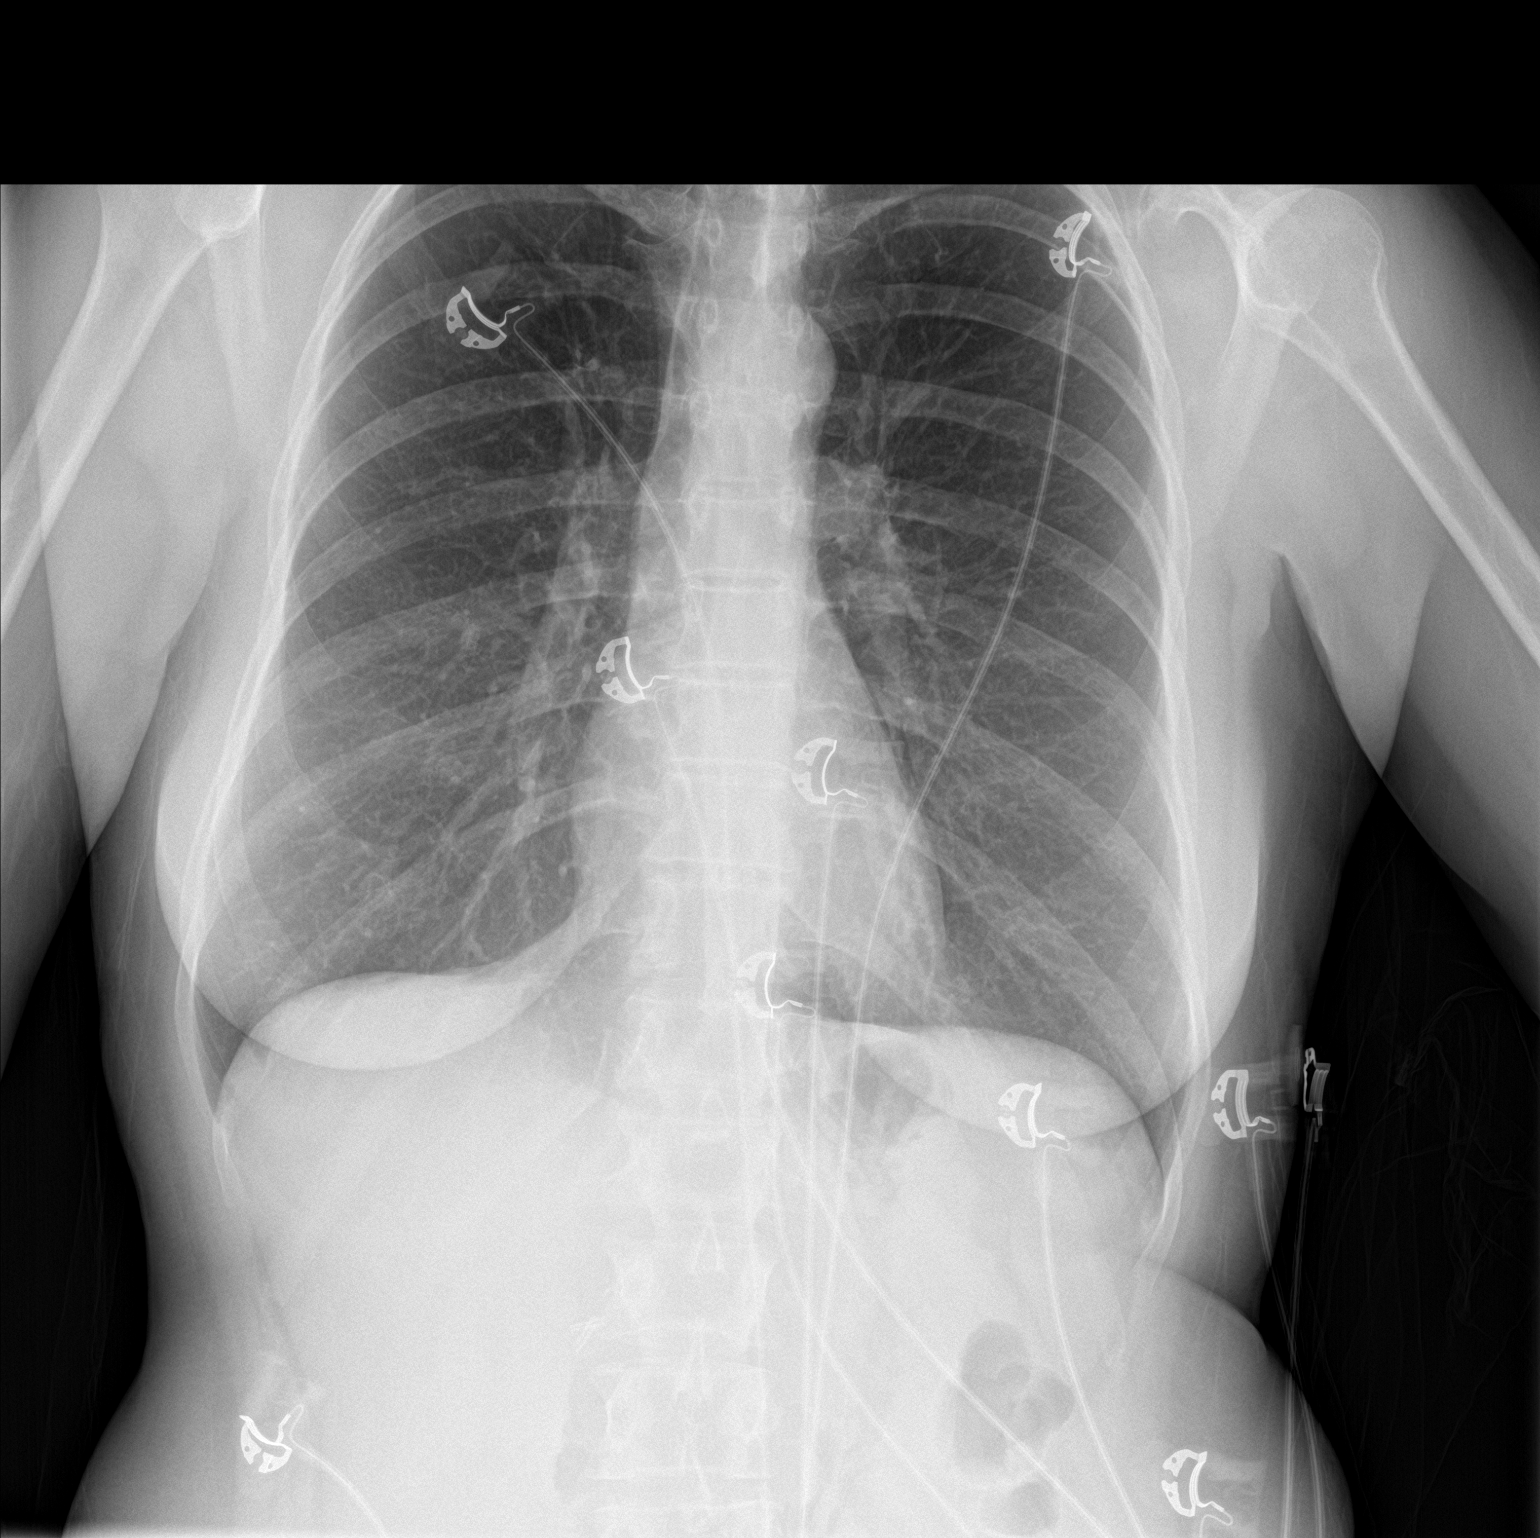

[chest lat]
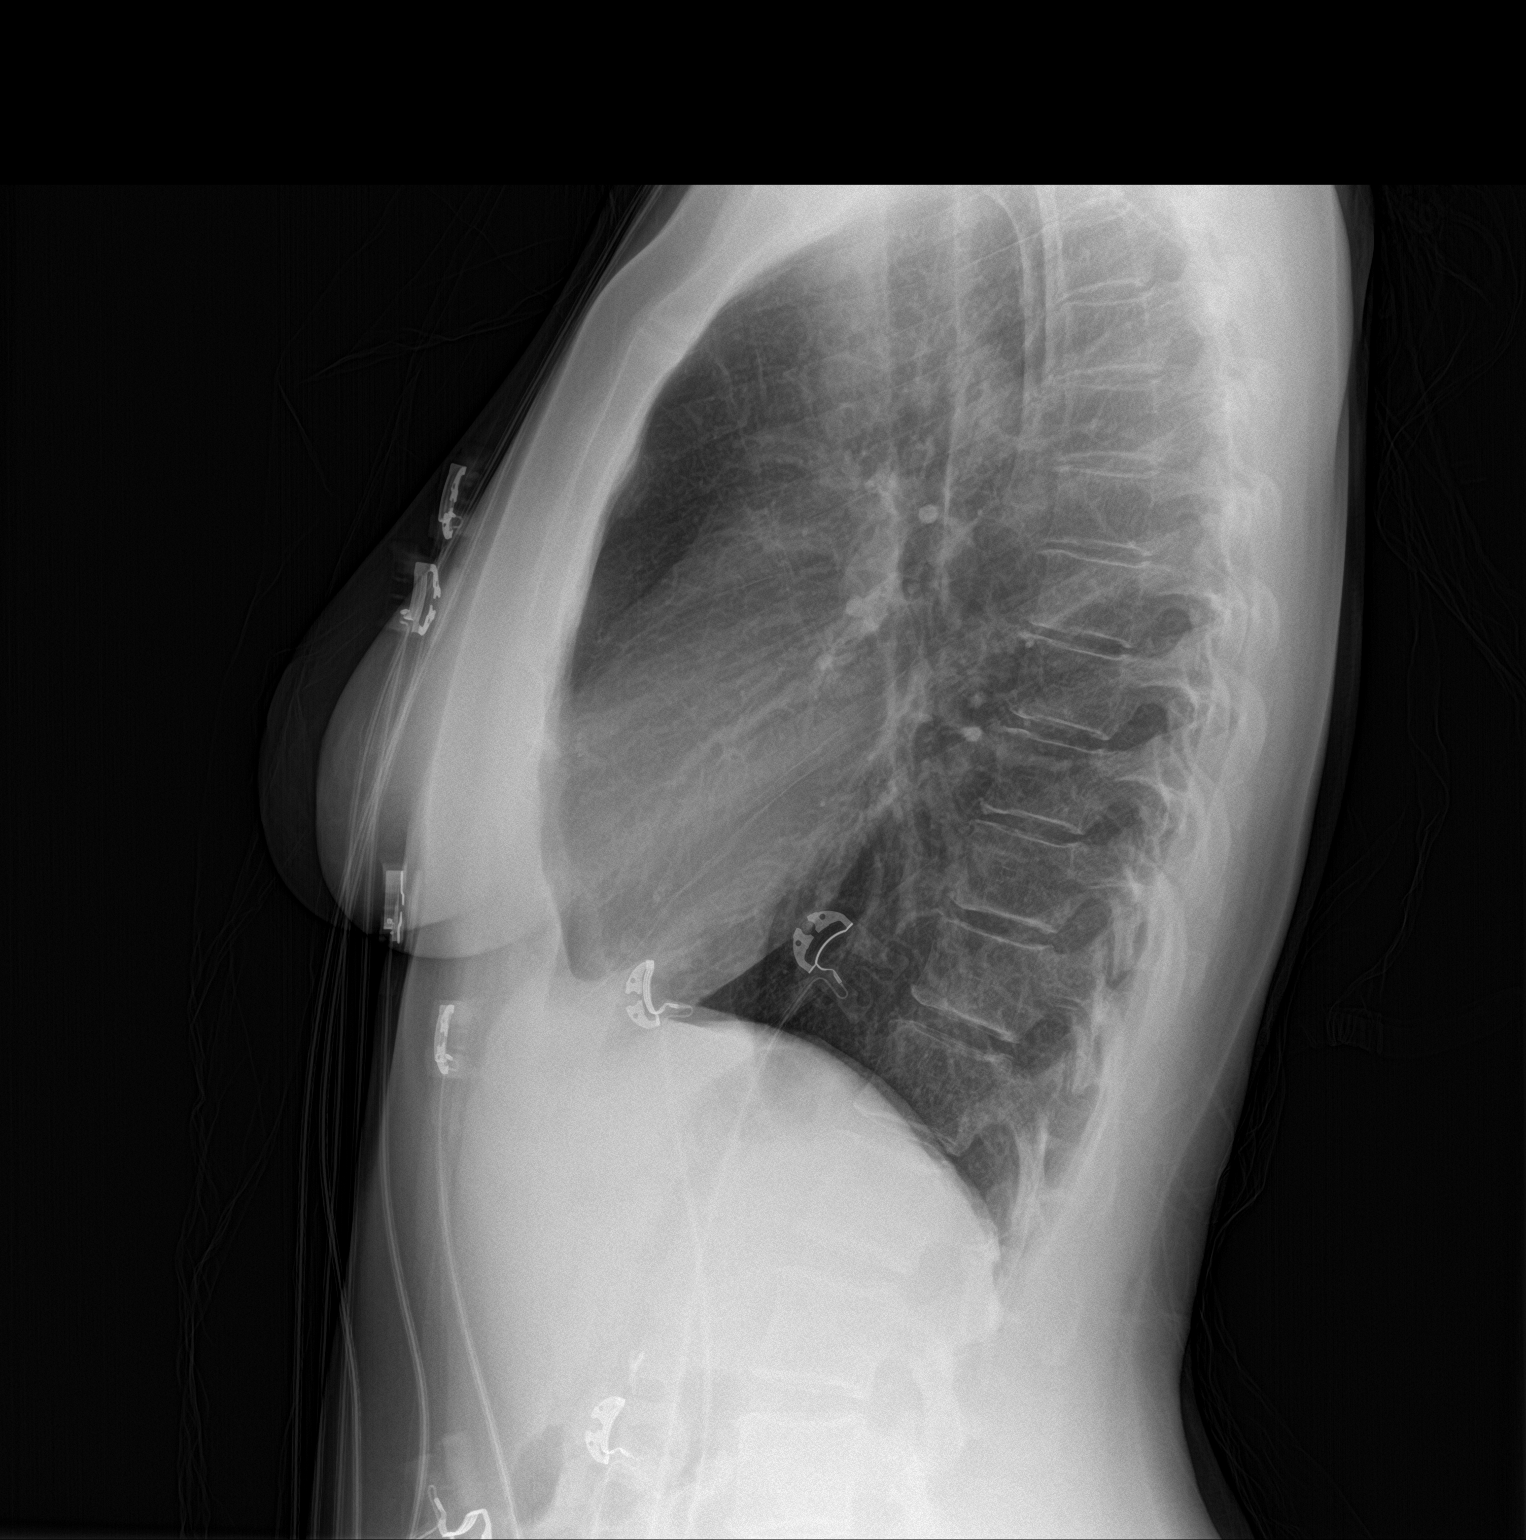

[2 of 2 positions shown; findings below may reference images not displayed]

FINDINGS: The heart size and mediastinal contours are within normal limits.
Both lungs are clear. No pneumothorax or pleural effusion is noted.
Hyperinflation of the lungs is noted. The visualized skeletal
structures are unremarkable.
IMPRESSION: No active cardiopulmonary disease.

## 2020-01-28 ENCOUNTER — Other Ambulatory Visit: Payer: Self-pay

## 2020-01-28 ENCOUNTER — Ambulatory Visit
Admission: RE | Admit: 2020-01-28 | Discharge: 2020-01-28 | Disposition: A | Discharge status: Home / Self Care | Payer: BLUE CROSS/BLUE SHIELD | Source: Ambulatory Visit | Attending: Obstetrics & Gynecology | Admitting: Obstetrics & Gynecology

## 2020-01-28 DIAGNOSIS — Z1231 Encounter for screening mammogram for malignant neoplasm of breast: Secondary | ICD-10-CM

## 2020-02-29 ENCOUNTER — Other Ambulatory Visit: Payer: Self-pay | Admitting: Obstetrics & Gynecology

## 2020-03-12 ENCOUNTER — Other Ambulatory Visit: Payer: Self-pay

## 2020-03-12 DIAGNOSIS — Z01419 Encounter for gynecological examination (general) (routine) without abnormal findings: Secondary | ICD-10-CM

## 2020-03-12 NOTE — Telephone Encounter (Signed)
Patient called regarding medication refill being denied.

## 2020-03-12 NOTE — Telephone Encounter (Signed)
Spoke to pt. Pt states wanting to know if she can get estradiol patches refilled. Pt is not out currently, but would like to get refills.  Pt had updated MMG in 01/28/2020 with Birads 1, negative and has next AEX scheduled for 8/31 at 4pm. Pt will wait for pharmacy to call when Rx is ready. Pt agreeable.   Routing to Dr Sabra Heck, Rx pended if approved. # 24, 2 RF to get to AEX. Pharmacy verified.

## 2020-03-14 MED ORDER — ESTRADIOL 0.05 MG/24HR TD PTTW: 1.0000 | MEDICATED_PATCH | TRANSDERMAL | 1 refills | Status: DC

## 2020-06-18 ENCOUNTER — Other Ambulatory Visit: Payer: Self-pay | Admitting: Internal Medicine

## 2020-06-18 DIAGNOSIS — N63 Unspecified lump in unspecified breast: Secondary | ICD-10-CM

## 2020-07-05 ENCOUNTER — Other Ambulatory Visit: Payer: Self-pay | Admitting: Internal Medicine

## 2020-07-05 ENCOUNTER — Ambulatory Visit
Admission: RE | Admit: 2020-07-05 | Discharge: 2020-07-05 | Disposition: A | Discharge status: Home / Self Care | Payer: BLUE CROSS/BLUE SHIELD | Source: Ambulatory Visit | Attending: Internal Medicine | Admitting: Internal Medicine

## 2020-07-05 ENCOUNTER — Other Ambulatory Visit: Payer: Self-pay

## 2020-07-05 DIAGNOSIS — N63 Unspecified lump in unspecified breast: Secondary | ICD-10-CM

## 2020-07-05 DIAGNOSIS — N6320 Unspecified lump in the left breast, unspecified quadrant: Secondary | ICD-10-CM

## 2020-08-23 NOTE — Progress Notes (Signed)
59 y.o. [id number] Married White or Caucasian female here for annual exam.  Doing well.  Has not had Covid vaccination yet.  She keeps thinking about having vaccination.  Scared to get Covid and scared to get vaccination.    Had tender lump that she felt.  Diagnostic imaging showed a fibroadenoma.    Denies vaginal bleeding.    PCP:  Thedora Hinders.  Appt was in July.  Had blood work done then as well.    Patient's last menstrual period was 11/24/2010.          Sexually active: Yes.    The current method of family planning is status post hysterectomy.    Exercising: Yes.    some Smoker:  yes  Health Maintenance: Pap:  2011 History of abnormal Pap:  yes MMG:  01-28-20 category c density birads 1:neg, 07-05-2020 left breast u/s category c density birads 2: neg  Colonoscopy:  2015 f/u 95yrs BMD:   07/2020 normal per patient TDaP:  2018 Pneumonia vaccine(s):  2019 Shingrix:   Not done Hep C testing: had done neg Screening Labs: poct urine-neg   reports that she has been smoking cigarettes. She started smoking about 39 years ago. She has a 36.00 pack-year smoking history. She has never used smokeless tobacco. She reports current alcohol use. She reports that she does not use drugs.  Past Medical History:  Diagnosis Date  . Anxiety   . COPD (chronic obstructive pulmonary disease) ([place]) 02/2017  . Fibromyalgia   . GERD (gastroesophageal reflux disease)   . Hyperlipidemia 11/06/2018  . IBS (irritable bowel syndrome)   . Neuroma of right leg   . PAT (paroxysmal atrial tachycardia) ([facility])    uses Rx PRN  . Precordial chest pain 11/06/2018    Past Surgical History:  Procedure Laterality Date  . CESAREAN SECTION    . GALLBLADDER SURGERY    . LAPAROSCOPIC VAGINAL HYSTERECTOMY  12/12/2010   ovaries remain  . TONSILLECTOMY AND ADENOIDECTOMY    . TUBAL LIGATION    . VAGINAL PROLAPSE REPAIR      Current Outpatient Medications  Medication Sig Dispense Refill  . albuterol (PROVENTIL  HFA;VENTOLIN HFA) 108 (90 Base) MCG/ACT inhaler TAKE 2 PUFFS BY MOUTH EVERY 6 HOURS AS NEEDED FOR WHEEZE OR SHORTNESS OF BREATH 8.5 g 5  . b complex vitamins tablet Take 1 tablet by mouth daily.    Marland Kitchen BIOTIN PO Take by mouth.    Marland Kitchen BREO ELLIPTA 100-25 MCG/INH AEPB daily.    . cetirizine (ZYRTEC) 10 MG tablet Take 10 mg by mouth daily.    . cholecalciferol (VITAMIN D) 1000 units tablet Take 2,000 Units by mouth every other day.     . cyclobenzaprine (FLEXERIL) 10 MG tablet Take 5-10 mg by mouth daily as needed.    Marland Kitchen estradiol (VIVELLE-DOT) 0.05 MG/24HR patch Place 1 patch (0.05 mg total) onto the skin 2 (two) times a week. 24 patch 1  . Fluocinolone Acetonide Scalp 0.01 % OIL SMARTSIG:Sparingly Topical    . fluticasone (FLONASE) 50 MCG/ACT nasal spray 2 (two) times daily.    Marland Kitchen ketoconazole (NIZORAL) 2 % shampoo Apply topically.    Marland Kitchen LORazepam (ATIVAN) 1 MG tablet Take 1 mg by mouth at bedtime.     . Multiple Vitamins-Minerals (PRESERVISION AREDS 2 PO) Take by mouth daily.    Marland Kitchen omeprazole (PRILOSEC) 40 MG capsule Take 40 mg by mouth daily.    . polyethylene glycol powder (GLYCOLAX/MIRALAX) powder MIX 1 CAPFUL IN 8OZ OF  WATER DAILY AS NEEDED FOR CONSTIPATION  5  . psyllium (METAMUCIL) 58.6 % packet Take 1 packet by mouth daily.    . Simethicone (GAS-X PO) Take by mouth.    . triamcinolone ointment (KENALOG) 0.1 % Apply topically 2 (two) times daily.    . vitamin C (ASCORBIC ACID) 500 MG tablet Take 500 mg by mouth daily.     No current facility-administered medications for this visit.    Family History  Problem Relation Age of Onset  . Hypertension Mother   . Other Mother        PAT-mother, sister, maternal grandmother  . COPD Mother   . Diabetes Father   . Kidney cancer Father   . Cancer Maternal Grandmother        liver  . Thyroid disease Maternal Grandmother   . Hypertension Maternal Grandmother   . Diabetes Maternal Grandmother   . Liver cancer Maternal Grandfather   . Cancer  Paternal Uncle        pancreatic  . Asthma Sister   . Melanoma Sister     Review of Systems  Constitutional: Negative.   HENT: Negative.   Eyes: Negative.   Respiratory: Negative.   Cardiovascular: Negative.   Gastrointestinal: Negative.   Endocrine: Negative.   Genitourinary: Negative.   Musculoskeletal: Negative.   Skin: Negative.   Allergic/Immunologic: Negative.   Neurological: Negative.   Hematological: Negative.   Psychiatric/Behavioral: Negative.     Exam:   BP 118/76   Pulse 68   Resp 16   Ht 5' 1.75" (1.568 m)   Wt 133 lb (60.3 kg)   LMP 11/24/2010   BMI 24.52 kg/m   Height: 5' 1.75" (156.8 cm)  General appearance: alert, cooperative and appears stated age Head: Normocephalic, without obvious abnormality, atraumatic Neck: no adenopathy, supple, symmetrical, trachea midline and thyroid normal to inspection and palpation Lungs: clear to auscultation bilaterally Breasts: normal appearance, no masses or tenderness Heart: regular rate and rhythm Abdomen: soft, non-tender; bowel sounds normal; no masses,  no organomegaly Extremities: extremities normal, atraumatic, no cyanosis or edema Skin: Skin color, texture, turgor normal. No rashes or lesions Lymph nodes: Cervical, supraclavicular, and axillary nodes normal. No abnormal inguinal nodes palpated Neurologic: Grossly normal   Pelvic: External genitalia:  no lesions              Urethra:  normal appearing urethra with no masses, tenderness or lesions              Bartholins and Skenes: normal                 Vagina: normal appearing vagina with normal color and discharge, no lesions              Cervix: absent              Pap taken: No. Bimanual Exam:  Uterus:  uterus absent              Adnexa: no mass, fullness, tenderness               Rectovaginal: Confirms               Anus:  normal sphincter tone, no lesions  Chaperone, Terence Lux, CMA, was present for exam.  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  Doing yearly. pap smear not indicated Lab work done with  Lilyan Gilford this summer Will decrease estradiol patch 0.0375mg  patches twice weekly.  #24/4RF.  She is going to give update in a month or two.   Colonoscopy due 2025 return annually or prn

## 2020-08-24 ENCOUNTER — Encounter: Payer: Self-pay | Admitting: Obstetrics & Gynecology

## 2020-08-24 ENCOUNTER — Ambulatory Visit (INDEPENDENT_AMBULATORY_CARE_PROVIDER_SITE_OTHER): Payer: BLUE CROSS/BLUE SHIELD | Admitting: Obstetrics & Gynecology

## 2020-08-24 ENCOUNTER — Other Ambulatory Visit: Payer: Self-pay

## 2020-08-24 VITALS — BP 118/76 | HR 68 | Resp 16 | Ht 61.75 in | Wt 133.0 lb

## 2020-08-24 DIAGNOSIS — Z0000 Encounter for general adult medical examination without abnormal findings: Secondary | ICD-10-CM | POA: Diagnosis not present

## 2020-08-24 DIAGNOSIS — Z01419 Encounter for gynecological examination (general) (routine) without abnormal findings: Secondary | ICD-10-CM | POA: Diagnosis not present

## 2020-08-24 LAB — POCT URINALYSIS DIPSTICK
Bilirubin, UA: NEGATIVE
Blood, UA: NEGATIVE
Glucose, UA: NEGATIVE
Ketones, UA: NEGATIVE
Leukocytes, UA: NEGATIVE
Nitrite, UA: NEGATIVE
Protein, UA: NEGATIVE
Urobilinogen, UA: NEGATIVE E.U./dL — AB
pH, UA: 5 (ref 5.0–8.0)

## 2020-08-24 MED ORDER — ESTRADIOL 0.0375 MG/24HR TD PTTW: 1.0000 | MEDICATED_PATCH | TRANSDERMAL | 3 refills | Status: DC

## 2020-08-31 ENCOUNTER — Other Ambulatory Visit: Payer: Self-pay | Admitting: Obstetrics & Gynecology

## 2020-08-31 DIAGNOSIS — Z01419 Encounter for gynecological examination (general) (routine) without abnormal findings: Secondary | ICD-10-CM

## 2020-08-31 NOTE — Telephone Encounter (Signed)
Rx changed to 0.0375mg  estradiol patches at last AEX 08/24/2020.  Rx denied.

## 2020-08-31 NOTE — Telephone Encounter (Signed)
Medication refill request: Estradiol 0.5mg  Last AEX:  08/24/20  Next AEX: not scheduled Last MMG (if hormonal medication request): 07/05/20  Benign Refill authorized: 24/1

## 2020-12-25 DIAGNOSIS — B029 Zoster without complications: Secondary | ICD-10-CM

## 2020-12-25 HISTORY — DX: Zoster without complications: B02.9

## 2021-04-21 ENCOUNTER — Other Ambulatory Visit: Payer: Self-pay

## 2021-04-21 ENCOUNTER — Emergency Department (HOSPITAL_COMMUNITY): Payer: Commercial Managed Care - PPO

## 2021-04-21 ENCOUNTER — Emergency Department (HOSPITAL_COMMUNITY)
Admission: EM | Admit: 2021-04-21 | Discharge: 2021-04-21 | Disposition: A | Discharge status: Home / Self Care | Payer: Commercial Managed Care - PPO | Attending: Emergency Medicine | Admitting: Emergency Medicine

## 2021-04-21 ENCOUNTER — Encounter (HOSPITAL_COMMUNITY): Payer: Self-pay

## 2021-04-21 DIAGNOSIS — F17210 Nicotine dependence, cigarettes, uncomplicated: Secondary | ICD-10-CM | POA: Diagnosis not present

## 2021-04-21 DIAGNOSIS — R1030 Lower abdominal pain, unspecified: Secondary | ICD-10-CM | POA: Diagnosis not present

## 2021-04-21 DIAGNOSIS — M545 Low back pain, unspecified: Secondary | ICD-10-CM | POA: Insufficient documentation

## 2021-04-21 DIAGNOSIS — Z7952 Long term (current) use of systemic steroids: Secondary | ICD-10-CM | POA: Insufficient documentation

## 2021-04-21 DIAGNOSIS — R350 Frequency of micturition: Secondary | ICD-10-CM | POA: Insufficient documentation

## 2021-04-21 DIAGNOSIS — Z8719 Personal history of other diseases of the digestive system: Secondary | ICD-10-CM | POA: Insufficient documentation

## 2021-04-21 DIAGNOSIS — J449 Chronic obstructive pulmonary disease, unspecified: Secondary | ICD-10-CM | POA: Insufficient documentation

## 2021-04-21 DIAGNOSIS — B029 Zoster without complications: Secondary | ICD-10-CM | POA: Diagnosis not present

## 2021-04-21 LAB — URINALYSIS, ROUTINE W REFLEX MICROSCOPIC
Bilirubin Urine: NEGATIVE
Glucose, UA: NEGATIVE mg/dL
Hgb urine dipstick: NEGATIVE
Ketones, ur: NEGATIVE mg/dL
Leukocytes,Ua: NEGATIVE
Nitrite: NEGATIVE
Protein, ur: NEGATIVE mg/dL
Specific Gravity, Urine: 1.001 — ABNORMAL LOW (ref 1.005–1.030)
pH: 7 (ref 5.0–8.0)

## 2021-04-21 LAB — CBC WITH DIFFERENTIAL/PLATELET
Abs Immature Granulocytes: 0.02 10*3/uL (ref 0.00–0.07)
Basophils Absolute: 0.1 10*3/uL (ref 0.0–0.1)
Basophils Relative: 1 %
Eosinophils Absolute: 0.2 10*3/uL (ref 0.0–0.5)
Eosinophils Relative: 3 %
HCT: 41.8 % (ref 36.0–46.0)
Hemoglobin: 13.8 g/dL (ref 12.0–15.0)
Immature Granulocytes: 0 %
Lymphocytes Relative: 40 %
Lymphs Abs: 3.1 10*3/uL (ref 0.7–4.0)
MCH: 31.5 pg (ref 26.0–34.0)
MCHC: 33 g/dL (ref 30.0–36.0)
MCV: 95.4 fL (ref 80.0–100.0)
Monocytes Absolute: 0.5 10*3/uL (ref 0.1–1.0)
Monocytes Relative: 7 %
Neutro Abs: 3.8 10*3/uL (ref 1.7–7.7)
Neutrophils Relative %: 49 %
Platelets: 219 10*3/uL (ref 150–400)
RBC: 4.38 MIL/uL (ref 3.87–5.11)
RDW: 12.2 % (ref 11.5–15.5)
WBC: 7.6 10*3/uL (ref 4.0–10.5)
nRBC: 0 % (ref 0.0–0.2)

## 2021-04-21 LAB — BASIC METABOLIC PANEL
Anion gap: 9 (ref 5–15)
BUN: 8 mg/dL (ref 6–20)
CO2: 26 mmol/L (ref 22–32)
Calcium: 9.5 mg/dL (ref 8.9–10.3)
Chloride: 107 mmol/L (ref 98–111)
Creatinine, Ser: 0.65 mg/dL (ref 0.44–1.00)
GFR, Estimated: >60 mL/min (ref >60)
Glucose, Bld: 91 mg/dL (ref 70–99)
Potassium: 4.3 mmol/L (ref 3.5–5.1)
Sodium: 142 mmol/L (ref 135–145)

## 2021-04-21 MED ORDER — HYDROCODONE-ACETAMINOPHEN 5-325 MG PO TABS: 1.0000 | ORAL_TABLET | Freq: Four times a day (QID) | ORAL | 0 refills | Status: DC | PRN

## 2021-04-21 MED ORDER — VALACYCLOVIR HCL 1 G PO TABS: 1000.0000 mg | ORAL_TABLET | Freq: Three times a day (TID) | ORAL | 0 refills | Status: AC

## 2021-04-21 MED ORDER — NAPROXEN 500 MG PO TABS: 500.0000 mg | ORAL_TABLET | Freq: Two times a day (BID) | ORAL | 0 refills | Status: DC | PRN

## 2021-04-21 MED ORDER — KETOROLAC TROMETHAMINE 15 MG/ML IJ SOLN
30.0000 mg | Freq: Once | INTRAMUSCULAR | Status: AC
  Administered 2021-04-21: 30 mg via INTRAMUSCULAR
  Filled 2021-04-21: qty 2

## 2021-04-21 NOTE — ED Provider Notes (Signed)
MSE was initiated and I personally evaluated the patient and placed orders (if any) at  8:09 PM on April 21, 2021.  The patient appears stable so that the remainder of the MSE may be completed by another provider.   Chief Complaint:  Right flank pain.   HPI:   Patient states that he started having right flank pain on Monday, also endorses urinary frequency and urgency.  Denies any fevers.  Denies any prior kidney infection.  Patient also has a rash on the side as well.  States that the rash is itchy, no burning.    ROS:  Right flank pain, urinary frequency and urgency   Physical Exam:  Gen:                Awake, no distress  HEENT:          Atraumatic  Resp:              Normal effort  Cardiac:          Normal rate  Abd:                Nondistended, nontender  MSK:               No true CVA tenderness to right flank, there is a rash on this side, maculopapular, not painful to touch.  atNeuro:            Speech clear,EOMS intact    Will obtain CT renal since patient is having right flank pain and urinary symptoms.    Initiation of care has begun. The patient has been counseled on the process, plan, and necessity for staying for the completion/evaluation, and the remainder of the medical screening examination    Alfredia Client, Hershal Coria 04/21/21 2021    Daleen Bo, MD 04/22/21 0010

## 2021-04-21 NOTE — ED Provider Notes (Signed)
[facility] DEPT Provider Note   CSN: [id number] Arrival date & time: 04/21/21  1936     History Chief Complaint  Patient presents with  . Flank Pain    Kim Morris is a 60 y.o. female with a hx of tobacco abuse, COPD, fibromyalgia, GERD, IBS, hyperlipidemia, and prior tubal ligation who presents to the ED with complaints of right lower back and abdominal pain over the past 4 days.  Patient states the pain is located in the right lower back and wraps around to the front of her lower abdomen on the ipsilateral side.  It is a constant discomfort with intermittent increases in pain.  Worse when she has to urinate but she is not having any dysuria, is having some frequency..  No specific alleviating factors.  She has also noted a rash in the area of her pain over the past 3 days that has been pruritic, she has applied topical steroids with improvement in the pruritus, she has also noted paresthesia sensation throughout distribution of pain/rash.  She was seen by her PCP today and instructed to come to the emergency department if her symptoms became worse.  She denies fever, chills, nausea, vomiting, diarrhea, or dysuria.  HPI     Past Medical History:  Diagnosis Date  . Anxiety   . COPD (chronic obstructive pulmonary disease) ([place]) 02/2017  . Fibromyalgia   . GERD (gastroesophageal reflux disease)   . Hyperlipidemia 11/06/2018  . IBS (irritable bowel syndrome)   . Neuroma of right leg   . PAT (paroxysmal atrial tachycardia) ([facility])    uses Rx PRN  . Precordial chest pain 11/06/2018    Patient Active Problem List   Diagnosis Date Noted  . Precordial chest pain 11/06/2018  . Hyperlipidemia 11/06/2018  . History of postmenopausal HRT 11/26/2017  . Tobacco abuse 11/26/2017  . COPD (chronic obstructive pulmonary disease) ([place]) 06/12/2017  . Temporomandibular joint (TMJ) pain 04/17/2017  . Fibromyalgia 10/09/2014    Past Surgical History:  Procedure  Laterality Date  . CESAREAN SECTION    . GALLBLADDER SURGERY    . LAPAROSCOPIC VAGINAL HYSTERECTOMY  12/12/2010   ovaries remain  . TONSILLECTOMY AND ADENOIDECTOMY    . TUBAL LIGATION    . VAGINAL PROLAPSE REPAIR       OB History    Gravida  4   Para  3   Term  3   Preterm  0   AB  1   Living  3     SAB  1   IAB  0   Ectopic  0   Multiple  0   Live Births  3           Family History  Problem Relation Age of Onset  . Hypertension Mother   . Other Mother        PAT-mother, sister, maternal grandmother  . COPD Mother   . Diabetes Father   . Kidney cancer Father   . Cancer Maternal Grandmother        liver  . Thyroid disease Maternal Grandmother   . Hypertension Maternal Grandmother   . Diabetes Maternal Grandmother   . Liver cancer Maternal Grandfather   . Cancer Paternal Uncle        pancreatic  . Asthma Sister   . Melanoma Sister     Social History   Tobacco Use  . Smoking status: Current Every Day Smoker    Packs/day: 1.00    Years: 36.00  Pack years: 36.00    Types: Cigarettes    Start date: 06/12/1981  . Smokeless tobacco: Never Used  . Tobacco comment: 3/4-1 PPD  Vaping Use  . Vaping Use: Never used  Substance Use Topics  . Alcohol use: Yes    Comment: 2-3 a month  . Drug use: No    Home Medications Prior to Admission medications   Medication Sig Start Date End Date Taking? Authorizing Provider  albuterol (PROVENTIL HFA;VENTOLIN HFA) 108 (90 Base) MCG/ACT inhaler TAKE 2 PUFFS BY MOUTH EVERY 6 HOURS AS NEEDED FOR WHEEZE OR SHORTNESS OF BREATH 03/31/19   Rigoberto Noel, MD  b complex vitamins tablet Take 1 tablet by mouth daily.    [provider]  BIOTIN PO Take by mouth.    [provider]  BREO ELLIPTA 100-25 MCG/INH AEPB daily. 03/08/17   [provider]  cetirizine (ZYRTEC) 10 MG tablet Take 10 mg by mouth daily.    [provider]  cholecalciferol (VITAMIN D) 1000 units tablet Take 2,000  Units by mouth every other day.     [provider]  cyclobenzaprine (FLEXERIL) 10 MG tablet Take 5-10 mg by mouth daily as needed. 06/08/20   [provider]  estradiol (VIVELLE-DOT) 0.0375 MG/24HR Place 1 patch onto the skin 2 (two) times a week. 08/26/20   Megan Salon, MD  estradiol (VIVELLE-DOT) 0.05 MG/24HR patch Place 1 patch (0.05 mg total) onto the skin 2 (two) times a week. 03/15/20   Megan Salon, MD  Fluocinolone Acetonide Scalp 0.01 % OIL SMARTSIG:Sparingly Topical 06/24/20   [provider]  fluticasone (FLONASE) 50 MCG/ACT nasal spray 2 (two) times daily. 03/06/17   [provider]  ketoconazole (NIZORAL) 2 % shampoo Apply topically. 06/24/20   [provider]  LORazepam (ATIVAN) 1 MG tablet Take 1 mg by mouth at bedtime.     [provider]  Multiple Vitamins-Minerals (PRESERVISION AREDS 2 PO) Take by mouth daily.    [provider]  omeprazole (PRILOSEC) 40 MG capsule Take 40 mg by mouth daily.    [provider]  polyethylene glycol powder (GLYCOLAX/MIRALAX) powder MIX 1 CAPFUL IN 8OZ OF WATER DAILY AS NEEDED FOR CONSTIPATION 11/24/15   [provider]  psyllium (METAMUCIL) 58.6 % packet Take 1 packet by mouth daily.    [provider]  Simethicone (GAS-X PO) Take by mouth.    [provider]  triamcinolone ointment (KENALOG) 0.1 % Apply topically 2 (two) times daily. 06/24/20   [provider]  vitamin C (ASCORBIC ACID) 500 MG tablet Take 500 mg by mouth daily.    [provider]    Allergies    Elemental sulfur, Magnesium-containing compounds, Morphine and related, and Silvadene [silver sulfadiazine]  Review of Systems   Review of Systems  Constitutional: Negative for chills and fever.  Respiratory: Negative for cough and shortness of breath.   Cardiovascular: Negative for chest pain.  Gastrointestinal: Positive for abdominal pain. Negative for diarrhea, nausea  and vomiting.  Genitourinary: Positive for frequency. Negative for dysuria, vaginal bleeding and vaginal discharge.  Musculoskeletal: Positive for back pain.  Skin: Positive for rash.  Neurological: Negative for syncope, weakness and numbness.  All other systems reviewed and are negative.   Physical Exam Updated Vital Signs BP 131/66   Pulse (!) 56   Temp 98.3 F (36.8 C) (Oral)   Resp 16   Ht 5\' 2"  (1.575 m)   Wt 61.2 kg   LMP  11/24/2010   SpO2 98%   BMI 24.69 kg/m   Physical Exam Vitals and nursing note reviewed.  Constitutional:      General: She is not in acute distress.    Appearance: She is well-developed. She is not toxic-appearing.  HENT:     Head: Normocephalic and atraumatic.  Eyes:     General:        Right eye: No discharge.        Left eye: No discharge.     Conjunctiva/sclera: Conjunctivae normal.  Cardiovascular:     Rate and Rhythm: Normal rate and regular rhythm.  Pulmonary:     Effort: Pulmonary effort is normal. No respiratory distress.     Breath sounds: Normal breath sounds. No wheezing, rhonchi or rales.  Abdominal:     General: There is no distension.     Palpations: Abdomen is soft.     Tenderness: There is no abdominal tenderness. There is no right CVA tenderness, left CVA tenderness, guarding or rebound.  Musculoskeletal:     Cervical back: Neck supple.     Comments: Right lumbar paraspinal muscles are tender to palpation.  Skin:    General: Skin is warm and dry.     Findings: Rash (Patient has a scattered papular rash to the right lower back that extends to the anterior aspect of the right lower abdomen. Does not cross midline. No pustules, vesicles, induration, or fluctuance.  Occurs in dermatomal pattern.) present.  Neurological:     Mental Status: She is alert.     Comments: Clear speech.  Sensation grossly intact bilateral lower extremities.  5/ 5 symmetric strength with plantar dorsiflexion bilaterally.  Patient is ambulatory.   Psychiatric:        Behavior: Behavior normal.     ED Results / Procedures / Treatments   Labs (all labs ordered are listed, but only abnormal results are displayed) Labs Reviewed  URINALYSIS, ROUTINE W REFLEX MICROSCOPIC - Abnormal; Notable for the following components:      Result Value   Color, Urine COLORLESS (*)    Specific Gravity, Urine 1.001 (*)    All other components within normal limits  BASIC METABOLIC PANEL  CBC WITH DIFFERENTIAL/PLATELET    EKG None  Radiology CT Renal Stone Study  Result Date: 04/21/2021 CLINICAL DATA:  Right-sided flank pain since Monday EXAM: CT ABDOMEN AND PELVIS WITHOUT CONTRAST TECHNIQUE: Multidetector CT imaging of the abdomen and pelvis was performed following the standard protocol without IV contrast. COMPARISON:  December 04, 2009. FINDINGS: Lower chest: Chronic mucoid impaction with calcifications in the lingula. Normal size heart. No significant pericardial effusion/thickening. Hepatobiliary: Unremarkable noncontrast appearance of the hepatic parenchyma. Gallbladder surgically absent. No biliary ductal dilation. Pancreas: Within normal limits. Spleen: Within normal limits. Adrenals/Urinary Tract: Adrenal glands are unremarkable. Kidneys are normal, without renal calculi, focal lesion, or hydronephrosis. Bladder is unremarkable. Stomach/Bowel: Stomach is predominantly decompressed limiting evaluation. No suspicious small bowel wall thickening or dilation. Appendix is grossly unremarkable. Scattered colonic diverticulosis without findings of acute diverticulitis. Vascular/Lymphatic: Aortic atherosclerosis. No enlarged abdominal or pelvic lymph nodes. Reproductive: Unremarkable. Other: No abdominopelvic ascites. Musculoskeletal: No acute or significant osseous findings. IMPRESSION: 1. No acute findings in the abdomen or pelvis. Specifically, no evidence of obstructive uropathy. 2. Scattered colonic diverticulosis without findings of acute  diverticulitis. 3. Aortic atherosclerosis. Aortic Atherosclerosis (ICD10-I70.0). Electronically Signed   By: Dahlia Bailiff MD   On: 04/21/2021 20:41    Procedures Procedures   Medications Ordered in ED Medications -  No data to display  ED Course  I have reviewed the triage vital signs and the nursing notes.  Pertinent labs & imaging results that were available during my care of the patient were reviewed by me and considered in my medical decision making (see chart for details).    MDM Rules/Calculators/A&P                         Patient presents to the ED with complaints of right lower back/abdominal pain as well as a rash.  She is nontoxic, resting comfortably, her vitals are without significant abnormality.  Additional history obtained:  Additional history obtained from chart review & nursing note review.   Lab Tests:  I reviewed and interpreted labs, which included:  CBC, BMP, urinalysis: Unremarkable.  Given patient is having some urinary frequency and urge to urinate we will add on urine culture, however urinalysis is grossly unremarkable, no UTI or hematuria.  Imaging Studies ordered:  CT renal stone study was ordered with by provider in triage, I independently reviewed, formal radiology impression shows:  1. No acute findings in the abdomen or pelvis. Specifically, no evidence of obstructive uropathy. 2. Scattered colonic diverticulosis without findings of acute diverticulitis. 3. Aortic atherosclerosis. Aortic Atherosclerosis   ED Course:  CT renal stone study without acute process, no definitive obstructive uropathy.  Urinalysis without UTI.  Appendix appears grossly unremarkable on CT scan.  Abdomen without peritoneal signs.  No neuro deficits in the lower extremities, ambulatory, do not suspect cord compression or cauda equina syndrome.  Her rash does not appear classic for shingles, however it does occur in a dermatomal pattern and with her associated paresthesias and  pain feel is reasonable to treat her for possible shingles. There are no signs of superimposed infection. Will initiate antiviral therapy, provide analgesics, and have her follow-up closely with her primary care provider.  [place] Controlled Substance reporting System queried. Allergy to morphine but tolerates norco.   I discussed results, treatment plan, need for follow-up, and return precautions with the patient. Provided opportunity for questions, patient confirmed understanding and is in agreement with plan.   Findings and plan of care discussed with supervising physician Dr. Kathrynn Humble who is in agreement.   Portions of this note were generated with [employer]. Dictation errors may occur despite best attempts at proofreading.  Final Clinical Impression(s) / ED Diagnoses Final diagnoses:  Herpes zoster without complication    Rx / DC Orders ED Discharge Orders         Ordered    valACYclovir (VALTREX) 1000 MG tablet  3 times daily        04/21/21 2325    HYDROcodone-acetaminophen (NORCO/VICODIN) 5-325 MG tablet  Every 6 hours PRN        04/21/21 2325    naproxen (NAPROSYN) 500 MG tablet  2 times daily PRN        04/21/21 2325           Avyan Livesay, Glynda Jaeger, PA-C 04/21/21 2326    Varney Biles, MD 04/21/21 2329

## 2021-04-21 NOTE — Discharge Instructions (Addendum)
You were seen in the emergency department today due to pain to your right side.  Your labs were overall reassuring.  Your CT scan showed findings of diverticulosis as well as some plaque in your arteries, please discuss with your primary care provider.  There were no findings of kidney stones on your CT scan.  At this time we are treating you for suspected shingles. We are sending you with a prescription for Valtrex to take 3 times per day for the next 7 days, this is an antiviral medication to treat for shingles.  We also send you home with the following medications to help with pain:  - Naproxen is a nonsteroidal anti-inflammatory medication that will help with pain and swelling. Be sure to take this medication as prescribed with food, 1 pill every 12 hours,  It should be taken with food, as it can cause stomach upset, and more seriously, stomach bleeding. Do not take other nonsteroidal anti-inflammatory medications with this such as Advil, Motrin, Aleve, Mobic, Goodie Powder, or Motrin.    -Norco- this is a narcotic/controlled substance medication that has potential addicting qualities.  We recommend that you take 1-2 tablets every 6 hours as needed for severe pain.  Do not drive or operate heavy machinery when taking this medicine as it can be sedating. Do not drink alcohol or take other sedating medications when taking this medicine for safety reasons.  Keep this out of reach of small children.  Please be aware this medicine has Tylenol in it (325 mg/tab) do not exceed the maximum dose of Tylenol in a day per over the counter recommendations should you decide to supplement with Tylenol over the counter.   We have prescribed you new medication(s) today. Discuss the medications prescribed today with your pharmacist as they can have adverse effects and interactions with your other medicines including over the counter and prescribed medications. Seek medical evaluation if you start to experience new or  abnormal symptoms after taking one of these medicines, seek care immediately if you start to experience difficulty breathing, feeling of your throat closing, facial swelling, or rash as these could be indications of a more serious allergic reaction  Please follow attached patient information handout.  Please follow-up with your primary care provider within 3 days for a recheck.  Return to the ER for new or worsening symptoms including but not limited to increased pain, new pain, fever, redness/swelling/drainage to the rash, new rash, inability to keep fluids down, trouble walking, passing out, chest pain, trouble breathing, or any other concerns.

## 2021-04-21 NOTE — ED Triage Notes (Signed)
Pt reports right sided flank pain that began on Monday. Pt endorses urinary frequency and urgency. Pt denies hematuria.

## 2021-04-23 LAB — URINE CULTURE: Culture: NO GROWTH

## 2021-06-24 ENCOUNTER — Emergency Department (HOSPITAL_COMMUNITY): Payer: Commercial Managed Care - PPO

## 2021-06-24 ENCOUNTER — Encounter (HOSPITAL_COMMUNITY): Payer: Self-pay | Admitting: Emergency Medicine

## 2021-06-24 ENCOUNTER — Other Ambulatory Visit: Payer: Self-pay

## 2021-06-24 ENCOUNTER — Emergency Department (HOSPITAL_COMMUNITY)
Admission: EM | Admit: 2021-06-24 | Discharge: 2021-06-24 | Disposition: A | Discharge status: Home / Self Care | Payer: Commercial Managed Care - PPO | Attending: Emergency Medicine | Admitting: Emergency Medicine

## 2021-06-24 DIAGNOSIS — S0990XA Unspecified injury of head, initial encounter: Secondary | ICD-10-CM | POA: Diagnosis present

## 2021-06-24 DIAGNOSIS — W182XXA Fall in (into) shower or empty bathtub, initial encounter: Secondary | ICD-10-CM | POA: Insufficient documentation

## 2021-06-24 DIAGNOSIS — R0781 Pleurodynia: Secondary | ICD-10-CM | POA: Diagnosis not present

## 2021-06-24 DIAGNOSIS — S0101XA Laceration without foreign body of scalp, initial encounter: Secondary | ICD-10-CM | POA: Insufficient documentation

## 2021-06-24 DIAGNOSIS — J449 Chronic obstructive pulmonary disease, unspecified: Secondary | ICD-10-CM | POA: Diagnosis not present

## 2021-06-24 DIAGNOSIS — W208XXA Other cause of strike by thrown, projected or falling object, initial encounter: Secondary | ICD-10-CM | POA: Insufficient documentation

## 2021-06-24 DIAGNOSIS — W19XXXA Unspecified fall, initial encounter: Secondary | ICD-10-CM

## 2021-06-24 DIAGNOSIS — F17210 Nicotine dependence, cigarettes, uncomplicated: Secondary | ICD-10-CM | POA: Insufficient documentation

## 2021-06-24 MED ORDER — BACITRACIN ZINC 500 UNIT/GM EX OINT
TOPICAL_OINTMENT | Freq: Once | CUTANEOUS | Status: AC
  Filled 2021-06-24: qty 0.9

## 2021-06-24 MED ORDER — ACETAMINOPHEN 500 MG PO TABS
1000.0000 mg | ORAL_TABLET | Freq: Once | ORAL | Status: AC
  Administered 2021-06-24: 1000 mg via ORAL
  Filled 2021-06-24: qty 2

## 2021-06-24 NOTE — Discharge Instructions (Addendum)
You have been seen and discharged from the emergency department.  Take Tylenol ibuprofen as needed.  Keep antibiotic ointment on the left scalp wound for the next 24 hours, you may then let it heal to the air.  Ice the right ribs.  Rest and recover.  Follow-up with your primary provider for reevaluation and further care. Take home medications as prescribed. If you have any worsening symptoms, severe chest pain, severe abdominal pain or further concerns for your health please return to an emergency department for further evaluation.

## 2021-06-24 NOTE — ED Provider Notes (Addendum)
[facility] EMERGENCY DEPARTMENT Provider Note   CSN: [id number] Arrival date & time: 06/24/21  1027     History Chief Complaint  Patient presents with   Lytle Michaels    Kim Morris is a 60 y.o. female.  HPI  60 year old female past medical history of HLD, COPD, fibromyalgia, anxiety presents emergency department with right rib pain and left head injury.  This morning patient was in the shower when she slipped.  She fell down onto the side of the tub on her right ribs and pulled the shower curtain down onto the left side of her head.  No loss of consciousness.  She is been ambulatory since the event.  No anticoagulation.  Currently her main complaint is right rib pain.  She denies any neck, back, other chest, abdominal/flank pain.  Past Medical History:  Diagnosis Date   Anxiety    COPD (chronic obstructive pulmonary disease) ([place]) 02/2017   Fibromyalgia    GERD (gastroesophageal reflux disease)    Hyperlipidemia 11/06/2018   IBS (irritable bowel syndrome)    Neuroma of right leg    PAT (paroxysmal atrial tachycardia) (H[facility])    uses Rx PRN   Precordial chest pain 11/06/2018    Patient Active Problem List   Diagnosis Date Noted   Precordial chest pain 11/06/2018   Hyperlipidemia 11/06/2018   History of postmenopausal HRT 11/26/2017   Tobacco abuse 11/26/2017   COPD (chronic obstructive pulmonary disease) ([place]) 06/12/2017   Temporomandibular joint (TMJ) pain 04/17/2017   Fibromyalgia 10/09/2014    Past Surgical History:  Procedure Laterality Date   CESAREAN SECTION     GALLBLADDER SURGERY     LAPAROSCOPIC VAGINAL HYSTERECTOMY  12/12/2010   ovaries remain   TONSILLECTOMY AND ADENOIDECTOMY     TUBAL LIGATION     VAGINAL PROLAPSE REPAIR       OB History     Gravida  4   Para  3   Term  3   Preterm  0   AB  1   Living  3      SAB  1   IAB  0   Ectopic  0   Multiple  0   Live Births  3           Family History  Problem  Relation Age of Onset   Hypertension Mother    Other Mother        PAT-mother, sister, maternal grandmother   COPD Mother    Diabetes Father    Kidney cancer Father    Cancer Maternal Grandmother        liver   Thyroid disease Maternal Grandmother    Hypertension Maternal Grandmother    Diabetes Maternal Grandmother    Liver cancer Maternal Grandfather    Cancer Paternal Uncle        pancreatic   Asthma Sister    Melanoma Sister     Social History   Tobacco Use   Smoking status: Every Day    Packs/day: 1.00    Years: 36.00    Pack years: 36.00    Types: Cigarettes    Start date: 06/12/1981   Smokeless tobacco: Never   Tobacco comments:    3/4-1 PPD  Vaping Use   Vaping Use: Never used  Substance Use Topics   Alcohol use: Yes    Comment: 2-3 a month   Drug use: No    Home Medications Prior to Admission medications   Medication Sig Start  Date End Date Taking? Authorizing Provider  albuterol (PROVENTIL HFA;VENTOLIN HFA) 108 (90 Base) MCG/ACT inhaler TAKE 2 PUFFS BY MOUTH EVERY 6 HOURS AS NEEDED FOR WHEEZE OR SHORTNESS OF BREATH 03/31/19   Rigoberto Noel, MD  b complex vitamins tablet Take 1 tablet by mouth daily.    [provider]  BIOTIN PO Take by mouth.    [provider]  BREO ELLIPTA 100-25 MCG/INH AEPB daily. 03/08/17   [provider]  cetirizine (ZYRTEC) 10 MG tablet Take 10 mg by mouth daily.    [provider]  cholecalciferol (VITAMIN D) 1000 units tablet Take 2,000 Units by mouth every other day.     [provider]  cyclobenzaprine (FLEXERIL) 10 MG tablet Take 5-10 mg by mouth daily as needed. 06/08/20   [provider]  estradiol (VIVELLE-DOT) 0.0375 MG/24HR Place 1 patch onto the skin 2 (two) times a week. 08/26/20   Megan Salon, MD  estradiol (VIVELLE-DOT) 0.05 MG/24HR patch Place 1 patch (0.05 mg total) onto the skin 2 (two) times a week. 03/15/20   Megan Salon, MD  Fluocinolone Acetonide Scalp 0.01  % OIL SMARTSIG:Sparingly Topical 06/24/20   [provider]  fluticasone (FLONASE) 50 MCG/ACT nasal spray 2 (two) times daily. 03/06/17   [provider]  HYDROcodone-acetaminophen (NORCO/VICODIN) 5-325 MG tablet Take 1-2 tablets by mouth every 6 (six) hours as needed. 04/21/21   Petrucelli, Samantha R, PA-C  ketoconazole (NIZORAL) 2 % shampoo Apply topically. 06/24/20   [provider]  LORazepam (ATIVAN) 1 MG tablet Take 1 mg by mouth at bedtime.     [provider]  Multiple Vitamins-Minerals (PRESERVISION AREDS 2 PO) Take by mouth daily.    [provider]  naproxen (NAPROSYN) 500 MG tablet Take 1 tablet (500 mg total) by mouth 2 (two) times daily as needed for moderate pain. 04/21/21   Petrucelli, Glynda Jaeger, PA-C  omeprazole (PRILOSEC) 40 MG capsule Take 40 mg by mouth daily.    [provider]  polyethylene glycol powder (GLYCOLAX/MIRALAX) powder MIX 1 CAPFUL IN 8OZ OF WATER DAILY AS NEEDED FOR CONSTIPATION 11/24/15   [provider]  psyllium (METAMUCIL) 58.6 % packet Take 1 packet by mouth daily.    [provider]  Simethicone (GAS-X PO) Take by mouth.    [provider]  triamcinolone ointment (KENALOG) 0.1 % Apply topically 2 (two) times daily. 06/24/20   [provider]  vitamin C (ASCORBIC ACID) 500 MG tablet Take 500 mg by mouth daily.    [provider]    Allergies    Elemental sulfur, Magnesium-containing compounds, Morphine and related, and Silvadene [silver sulfadiazine]  Review of Systems   Review of Systems  Constitutional:  Negative for chills and fever.  HENT:  Negative for facial swelling.   Eyes:  Negative for visual disturbance.  Respiratory:  Negative for shortness of breath.   Cardiovascular:  Negative for chest pain.  Gastrointestinal:  Negative for abdominal pain, diarrhea and vomiting.  Genitourinary:  Negative for flank pain and hematuria.  Musculoskeletal:  Negative  for back pain, neck pain and neck stiffness.       + Right lower rib pain  Skin:  Positive for wound. Negative for rash.  Neurological:  Negative for numbness and headaches.   Physical Exam Updated Vital Signs BP 138/70 (BP Location: Left Arm)   Pulse 74   Temp 97.6 F (36.4 C) (Oral)   Resp 16   Ht 5\' 2"  (  1.575 m)   Wt 61.2 kg   LMP 11/24/2010   SpO2 98%   BMI 24.69 kg/m   Physical Exam Vitals and nursing note reviewed.  Constitutional:      General: She is not in acute distress.    Appearance: Normal appearance.  HENT:     Head: Normocephalic.     Comments: Small 1 cm superficial laceration to the left parietal scalp, bleeding controlled, no gaping    Mouth/Throat:     Mouth: Mucous membranes are moist.  Eyes:     Pupils: Pupils are equal, round, and reactive to light.  Cardiovascular:     Rate and Rhythm: Normal rate.  Pulmonary:     Effort: Pulmonary effort is normal. No respiratory distress.  Abdominal:     Palpations: Abdomen is soft.     Tenderness: There is no abdominal tenderness. There is no guarding or rebound.     Comments: No flank pain, no ecchymosis  Musculoskeletal:     Cervical back: No rigidity or tenderness.     Comments: Tenderness to palpation of the right lower lateral ribs  Skin:    General: Skin is warm.  Neurological:     Mental Status: She is alert and oriented to person, place, and time. Mental status is at baseline.  Psychiatric:        Mood and Affect: Mood normal.    ED Results / Procedures / Treatments   Labs (all labs ordered are listed, but only abnormal results are displayed) Labs Reviewed - No data to display  EKG None  Radiology DG Ribs Unilateral W/Chest Right  Result Date: 06/24/2021 CLINICAL DATA:  Rib pain after a fall in the shower. EXAM: RIGHT RIBS AND CHEST - 3+ VIEW COMPARISON:  Chest x-ray 10/24/2018 FINDINGS: Lungs are hyperexpanded. The lungs are clear without focal pneumonia, edema, pneumothorax or pleural  effusion. The cardiopericardial silhouette is within normal limits for size. Radio-opaque marker has been placed on the skin at the site of patient concern. No evidence for an acute displaced right-sided rib fracture. IMPRESSION: No evidence for acute displaced right-sided rib fracture. No pneumothorax or pleural effusion. Electronically Signed   By: Misty Stanley M.D.   On: 06/24/2021 08:21    Procedures Procedures   Medications Ordered in ED Medications  bacitracin ointment ( Topical Given 06/24/21 0856)  acetaminophen (TYLENOL) tablet 1,000 mg (1,000 mg Oral Given 06/24/21 0857)    ED Course  I have reviewed the triage vital signs and the nursing notes.  Pertinent labs & imaging results that were available during my care of the patient were reviewed by me and considered in my medical decision making (see chart for details).    MDM Rules/Calculators/A&P                          60 year old female presents emergency department after a slip and fall in the shower with right rib injury and left head laceration.  The laceration is approximately 1 cm, superficial, no active bleeding, no gaping, no need for further repair.  Patient states her tetanus has been up-to-date in the last 3 to 4 years.  No neck pain or back pain.  She has reproducible tenderness to palpation in the right lower lateral ribs along the bony prominences.  No abdominal flank pain, no ecchymosis.  X-ray shows no rib fracture, no pneumothorax or lung abnormality.  Low suspicion for intra-abdominal injury given the low mechanism and completely benign  abdominal exam.  No indication for emergent CT imaging at this time.  She has normal vital signs, is not anticoagulated.  Has been ambulatory here in the department, using the restroom without difficulty, no visible hematuria.  Plan to treat conservatively as an outpatient.  Patient will be discharged and treated as an outpatient.  Discharge plan and strict return to ED precautions  discussed, patient verbalizes understanding and agreement.  Final Clinical Impression(s) / ED Diagnoses Final diagnoses:  Fall, initial encounter  Minor head injury, initial encounter  Rib pain on right side    Rx / DC Orders ED Discharge Orders     None        Lorelle Gibbs, DO 06/24/21 [place], [place], DO 06/24/21 [phone]

## 2021-06-24 NOTE — ED Triage Notes (Signed)
Pt reports being in the shower this morning when she slipped and fell, grabbed the shower curtain and the rod fell and the ring hooks hit her head. Pt fell over bathtub wall on her rt side. Pt reports rt rib cage pain, denies sob. Also about 1 inch lac to top left side of head, bleeding controlled. Pt not on blood thinners.

## 2021-09-15 ENCOUNTER — Other Ambulatory Visit: Payer: Self-pay | Admitting: Obstetrics & Gynecology

## 2021-09-15 NOTE — Telephone Encounter (Signed)
LMOVM for pt to call in regards to rx refill request

## 2021-09-30 ENCOUNTER — Emergency Department (HOSPITAL_COMMUNITY)
Admission: EM | Admit: 2021-09-30 | Discharge: 2021-09-30 | Disposition: A | Discharge status: Home / Self Care | Payer: Commercial Managed Care - PPO | Attending: Emergency Medicine | Admitting: Emergency Medicine

## 2021-09-30 ENCOUNTER — Emergency Department (HOSPITAL_COMMUNITY): Payer: Commercial Managed Care - PPO

## 2021-09-30 DIAGNOSIS — F17200 Nicotine dependence, unspecified, uncomplicated: Secondary | ICD-10-CM | POA: Insufficient documentation

## 2021-09-30 DIAGNOSIS — R110 Nausea: Secondary | ICD-10-CM | POA: Diagnosis not present

## 2021-09-30 DIAGNOSIS — Z5321 Procedure and treatment not carried out due to patient leaving prior to being seen by health care provider: Secondary | ICD-10-CM | POA: Insufficient documentation

## 2021-09-30 DIAGNOSIS — N9489 Other specified conditions associated with female genital organs and menstrual cycle: Secondary | ICD-10-CM | POA: Insufficient documentation

## 2021-09-30 DIAGNOSIS — R531 Weakness: Secondary | ICD-10-CM | POA: Diagnosis not present

## 2021-09-30 DIAGNOSIS — R42 Dizziness and giddiness: Secondary | ICD-10-CM | POA: Insufficient documentation

## 2021-09-30 LAB — URINALYSIS, ROUTINE W REFLEX MICROSCOPIC
Bilirubin Urine: NEGATIVE
Glucose, UA: NEGATIVE mg/dL
Hgb urine dipstick: NEGATIVE
Ketones, ur: NEGATIVE mg/dL
Leukocytes,Ua: NEGATIVE
Nitrite: NEGATIVE
Protein, ur: NEGATIVE mg/dL
Specific Gravity, Urine: 1.002 — ABNORMAL LOW (ref 1.005–1.030)
pH: 7 (ref 5.0–8.0)

## 2021-09-30 LAB — CBC WITH DIFFERENTIAL/PLATELET
Abs Immature Granulocytes: 0.01 10*3/uL (ref 0.00–0.07)
Basophils Absolute: 0.1 10*3/uL (ref 0.0–0.1)
Basophils Relative: 2 %
Eosinophils Absolute: 0.3 10*3/uL (ref 0.0–0.5)
Eosinophils Relative: 4 %
HCT: 43.2 % (ref 36.0–46.0)
Hemoglobin: 14.6 g/dL (ref 12.0–15.0)
Immature Granulocytes: 0 %
Lymphocytes Relative: 32 %
Lymphs Abs: 2.2 10*3/uL (ref 0.7–4.0)
MCH: 31.8 pg (ref 26.0–34.0)
MCHC: 33.8 g/dL (ref 30.0–36.0)
MCV: 94.1 fL (ref 80.0–100.0)
Monocytes Absolute: 0.5 10*3/uL (ref 0.1–1.0)
Monocytes Relative: 7 %
Neutro Abs: 3.7 10*3/uL (ref 1.7–7.7)
Neutrophils Relative %: 55 %
Platelets: 224 10*3/uL (ref 150–400)
RBC: 4.59 MIL/uL (ref 3.87–5.11)
RDW: 12 % (ref 11.5–15.5)
WBC: 6.7 10*3/uL (ref 4.0–10.5)
nRBC: 0 % (ref 0.0–0.2)

## 2021-09-30 LAB — COMPREHENSIVE METABOLIC PANEL
ALT: 23 U/L (ref 0–44)
AST: 27 U/L (ref 15–41)
Albumin: 4 g/dL (ref 3.5–5.0)
Alkaline Phosphatase: 92 U/L (ref 38–126)
Anion gap: 5 (ref 5–15)
BUN: 8 mg/dL (ref 6–20)
CO2: 28 mmol/L (ref 22–32)
Calcium: 9.6 mg/dL (ref 8.9–10.3)
Chloride: 106 mmol/L (ref 98–111)
Creatinine, Ser: 0.63 mg/dL (ref 0.44–1.00)
GFR, Estimated: >60 mL/min (ref >60)
Glucose, Bld: 86 mg/dL (ref 70–99)
Potassium: 4.7 mmol/L (ref 3.5–5.1)
Sodium: 139 mmol/L (ref 135–145)
Total Bilirubin: 0.6 mg/dL (ref 0.3–1.2)
Total Protein: 7.3 g/dL (ref 6.5–8.1)

## 2021-09-30 LAB — LIPASE, BLOOD: Lipase: 26 U/L (ref 11–51)

## 2021-09-30 LAB — I-STAT BETA HCG BLOOD, ED (MC, WL, AP ONLY): I-stat hCG, quantitative: <5 m[IU]/mL (ref <5)

## 2021-09-30 LAB — TROPONIN I (HIGH SENSITIVITY): Troponin I (High Sensitivity): 4 ng/L (ref <18)

## 2021-09-30 MED ORDER — ONDANSETRON 4 MG PO TBDP: 4.0000 mg | ORAL_TABLET | Freq: Once | ORAL | Status: DC

## 2021-09-30 NOTE — ED Provider Notes (Signed)
Emergency Medicine Provider Triage Evaluation Note  Kim Morris , a 60 y.o. female  was evaluated in triage.  Pt complains of lightheadedness and nausea.  She has a history of fibromyalgia and daily smoking.  She denies a history of hypertension.  She woke up today feeling lightheaded and nauseous.  No vomiting, no chest pain.  Blood pressure elevated at home and at work.  Came in for evaluation.  She denies any room spinning dizziness or disequilibrium.  Review of Systems  Positive: Nausea Negative: Chest pain  Physical Exam  BP (!) 151/70 (BP Location: Left Arm)   Pulse 71   Temp 98 F (36.7 C) (Oral)   Resp 14   LMP 11/24/2010   SpO2 100%  Gen:   Awake, no distress   Resp:  Normal effort  MSK:   Moves extremities without difficulty  Other:  No nystagmus  Medical Decision Making  Medically screening exam initiated at 9:26 AM.  Appropriate orders placed.  Kim Morris was informed that the remainder of the evaluation will be completed by another provider, this initial triage assessment does not replace that evaluation, and the importance of remaining in the ED until their evaluation is complete.  Nigel Bridgeman. Lightheaded + smoking No hld/htn Work up initiated   Margarita Mail, PA-C 09/30/21 0932    Regan Lemming, MD 09/30/21 [phone]

## 2021-09-30 NOTE — ED Triage Notes (Signed)
Today on waking at 0530 patient having weakness, nausea and dizziness-worse with movement, high blood pressure, and neck pain.

## 2021-10-05 ENCOUNTER — Other Ambulatory Visit: Payer: Self-pay | Admitting: Internal Medicine

## 2021-10-05 DIAGNOSIS — E7800 Pure hypercholesterolemia, unspecified: Secondary | ICD-10-CM

## 2021-10-25 ENCOUNTER — Ambulatory Visit
Admission: RE | Admit: 2021-10-25 | Discharge: 2021-10-25 | Disposition: A | Discharge status: Home / Self Care | Payer: No Typology Code available for payment source | Source: Ambulatory Visit | Attending: Internal Medicine | Admitting: Internal Medicine

## 2021-10-25 DIAGNOSIS — E7800 Pure hypercholesterolemia, unspecified: Secondary | ICD-10-CM

## 2021-11-24 ENCOUNTER — Ambulatory Visit (INDEPENDENT_AMBULATORY_CARE_PROVIDER_SITE_OTHER): Payer: Commercial Managed Care - PPO | Admitting: Obstetrics & Gynecology

## 2021-11-24 ENCOUNTER — Other Ambulatory Visit: Payer: Self-pay

## 2021-11-24 ENCOUNTER — Encounter (HOSPITAL_BASED_OUTPATIENT_CLINIC_OR_DEPARTMENT_OTHER): Payer: Self-pay | Admitting: Obstetrics & Gynecology

## 2021-11-24 VITALS — BP 128/65 | HR 72 | Ht 62.0 in | Wt 138.2 lb

## 2021-11-24 DIAGNOSIS — Z90710 Acquired absence of both cervix and uterus: Secondary | ICD-10-CM | POA: Diagnosis not present

## 2021-11-24 DIAGNOSIS — Z720 Tobacco use: Secondary | ICD-10-CM | POA: Diagnosis not present

## 2021-11-24 DIAGNOSIS — Z01419 Encounter for gynecological examination (general) (routine) without abnormal findings: Secondary | ICD-10-CM

## 2021-11-24 DIAGNOSIS — I2510 Atherosclerotic heart disease of native coronary artery without angina pectoris: Secondary | ICD-10-CM | POA: Insufficient documentation

## 2021-11-24 DIAGNOSIS — R931 Abnormal findings on diagnostic imaging of heart and coronary circulation: Secondary | ICD-10-CM

## 2021-11-24 DIAGNOSIS — Z1231 Encounter for screening mammogram for malignant neoplasm of breast: Secondary | ICD-10-CM

## 2021-11-24 DIAGNOSIS — Z780 Asymptomatic menopausal state: Secondary | ICD-10-CM

## 2021-11-24 DIAGNOSIS — R030 Elevated blood-pressure reading, without diagnosis of hypertension: Secondary | ICD-10-CM | POA: Insufficient documentation

## 2021-11-24 MED ORDER — ESTRADIOL 0.0375 MG/24HR TD PTTW: 1.0000 | MEDICATED_PATCH | TRANSDERMAL | 3 refills | Status: DC

## 2021-11-24 NOTE — Progress Notes (Signed)
60 y.o. [id number] Married White or Caucasian female here for annual exam.  Has coronary calcium testing done in early November.  Has questions.  Discussed results with her.  She has been prescribed baby ASA and crestor.  Hasn't started either.    No vaginal bleeding.    Patient's last menstrual period was 11/24/2010.          Sexually active: Yes.    The current method of family planning is status post hysterectomy.    Exercising: No.   Smoker:  yes  Health Maintenance: Pap:  not indicated History of abnormal Pap:  remote hx MMG:  07/05/2020  Colonoscopy:  2015 follow up 10 years BMD:   07/2020 Screening Labs: done with PCP   reports that she has been smoking cigarettes. She started smoking about 40 years ago. She has a 36.00 pack-year smoking history. She has never used smokeless tobacco. She reports current alcohol use. She reports that she does not use drugs.  Past Medical History:  Diagnosis Date   Anxiety    COPD (chronic obstructive pulmonary disease) ([place]) 02/2017   Fibromyalgia    GERD (gastroesophageal reflux disease)    Hyperlipidemia 11/06/2018   IBS (irritable bowel syndrome)    Neuroma of right leg    PAT (paroxysmal atrial tachycardia) ([facility])    uses Rx PRN   Precordial chest pain 11/06/2018    Past Surgical History:  Procedure Laterality Date   CESAREAN SECTION     GALLBLADDER SURGERY     LAPAROSCOPIC VAGINAL HYSTERECTOMY  12/12/2010   ovaries remain   TONSILLECTOMY AND ADENOIDECTOMY     TUBAL LIGATION     VAGINAL PROLAPSE REPAIR      Current Outpatient Medications  Medication Sig Dispense Refill   albuterol (PROVENTIL HFA;VENTOLIN HFA) 108 (90 Base) MCG/ACT inhaler TAKE 2 PUFFS BY MOUTH EVERY 6 HOURS AS NEEDED FOR WHEEZE OR SHORTNESS OF BREATH 8.5 g 5   b complex vitamins tablet Take 1 tablet by mouth daily.     BREO ELLIPTA 100-25 MCG/INH AEPB daily.     cetirizine (ZYRTEC) 10 MG tablet Take 10 mg by mouth daily.     cholecalciferol (VITAMIN D) 1000  units tablet Take 2,000 Units by mouth every other day.      DULoxetine (CYMBALTA) 20 MG capsule Take 20 mg by mouth daily.     fluticasone (FLONASE) 50 MCG/ACT nasal spray 2 (two) times daily.     LORazepam (ATIVAN) 1 MG tablet Take 1 mg by mouth at bedtime.      Multiple Vitamins-Minerals (PRESERVISION AREDS 2 PO) Take by mouth daily.     omeprazole (PRILOSEC) 40 MG capsule Take 40 mg by mouth daily.     polyethylene glycol powder (GLYCOLAX/MIRALAX) powder MIX 1 CAPFUL IN 8OZ OF WATER DAILY AS NEEDED FOR CONSTIPATION  5   psyllium (METAMUCIL) 58.6 % packet Take 1 packet by mouth daily.     Simethicone (GAS-X PO) Take by mouth.     triamcinolone ointment (KENALOG) 0.1 % Apply topically 2 (two) times daily.     vitamin C (ASCORBIC ACID) 500 MG tablet Take 500 mg by mouth daily.     BIOTIN PO Take by mouth. (Patient not taking: Reported on 11/24/2021)     cyclobenzaprine (FLEXERIL) 10 MG tablet Take 5-10 mg by mouth daily as needed. (Patient not taking: Reported on 11/24/2021)     estradiol (VIVELLE-DOT) 0.0375 MG/24HR Place 1 patch onto the skin 2 (two) times a week. 24 patch 3  No current facility-administered medications for this visit.    Family History  Problem Relation Age of Onset   Hypertension Mother    Other Mother        PAT-mother, sister, maternal grandmother   COPD Mother    Diabetes Father    Kidney cancer Father    Cancer Maternal Grandmother        liver   Thyroid disease Maternal Grandmother    Hypertension Maternal Grandmother    Diabetes Maternal Grandmother    Liver cancer Maternal Grandfather    Cancer Paternal Uncle        pancreatic   Asthma Sister    Melanoma Sister     Review of Systems  All other systems reviewed and are negative.  Exam:   BP 128/65 (BP Location: Left Arm, Patient Position: Sitting, Cuff Size: Normal)   Pulse 72   Ht 5\' 2"  (1.575 m) Comment: reported  Wt 138 lb 3.2 oz (62.7 kg)   LMP 11/24/2010   BMI 25.28 kg/m   Height: 5\' 2"   (157.5 cm) (reported)  General appearance: alert, cooperative and appears stated age Head: Normocephalic, without obvious abnormality, atraumatic Neck: no adenopathy, supple, symmetrical, trachea midline and thyroid normal to inspection and palpation Lungs: clear to auscultation bilaterally Breasts: normal appearance, no masses or tenderness Heart: regular rate and rhythm Abdomen: soft, non-tender; bowel sounds normal; no masses,  no organomegaly Extremities: extremities normal, atraumatic, no cyanosis or edema Skin: Skin color, texture, turgor normal. No rashes or lesions Lymph nodes: Cervical, supraclavicular, and axillary nodes normal. No abnormal inguinal nodes palpated Neurologic: Grossly normal   Pelvic: External genitalia:  no lesions              Urethra:  normal appearing urethra with no masses, tenderness or lesions              Bartholins and Skenes: normal                 Vagina: normal appearing vagina with normal color and no discharge, no lesions              Cervix: absent              Pap taken: No. Bimanual Exam:  Uterus:  uterus absent              Adnexa: no mass, fullness, tenderness               Rectovaginal: Confirms               Anus:  normal sphincter tone, no lesions  Chaperone, Octaviano Batty, CMA, was present for exam.  Assessment/Plan: 1. Well woman exam with routine gynecological exam - pap smear not indicated - pt aware MMG due.  Order placed. - colonoscopy 2015, follow up 10 years - plan BMD around age 5 - lab work done with PCP - vaccines/care gaps reviewed and updated  2. Postmenopausal - estradiol (VIVELLE-DOT) 0.0375 MG/24HR; Place 1 patch onto the skin 2 (two) times a week.  Dispense: 24 patch; Refill: 3 - will maintain current dosage this year and try to lower dosage next year.  3. History of LAVH  4. Tobacco user  5. Elevated coronary artery calcium score - encouraged pt to start baby ASA and cholesterol lower agent as  recommended

## 2022-11-01 ENCOUNTER — Other Ambulatory Visit (HOSPITAL_BASED_OUTPATIENT_CLINIC_OR_DEPARTMENT_OTHER): Payer: Self-pay | Admitting: Obstetrics & Gynecology

## 2022-11-01 DIAGNOSIS — Z1231 Encounter for screening mammogram for malignant neoplasm of breast: Secondary | ICD-10-CM

## 2022-11-22 ENCOUNTER — Telehealth (HOSPITAL_BASED_OUTPATIENT_CLINIC_OR_DEPARTMENT_OTHER): Payer: Self-pay | Admitting: *Deleted

## 2022-11-22 NOTE — Telephone Encounter (Signed)
LMOVM for pt to call office 

## 2022-11-22 NOTE — Telephone Encounter (Signed)
-----   Message from Megan Salon, MD sent at 11/22/2022  5:46 AM EST ----- Regarding: mammogram Pt is on HRT and has not done her mammogram since 2021.  She will need a refill for HRT soon.  She does need to do her mammogram before I can complete the refill.  Her mammogram order has been placed and is active.  If she has done this somewhere else, can we try to get a copy of the results.  There is nothing in Care Everywhere, just FYI.  Thanks.  MSM

## 2022-12-06 IMAGING — DX DG CHEST 2V
2 series · 2 of 2 positions shown · non-contrast
Comparison: 06/30/2021

CLINICAL DATA: Chest pain, shortness of breath

EXAM:
CHEST - 2 VIEW

[chest pa]
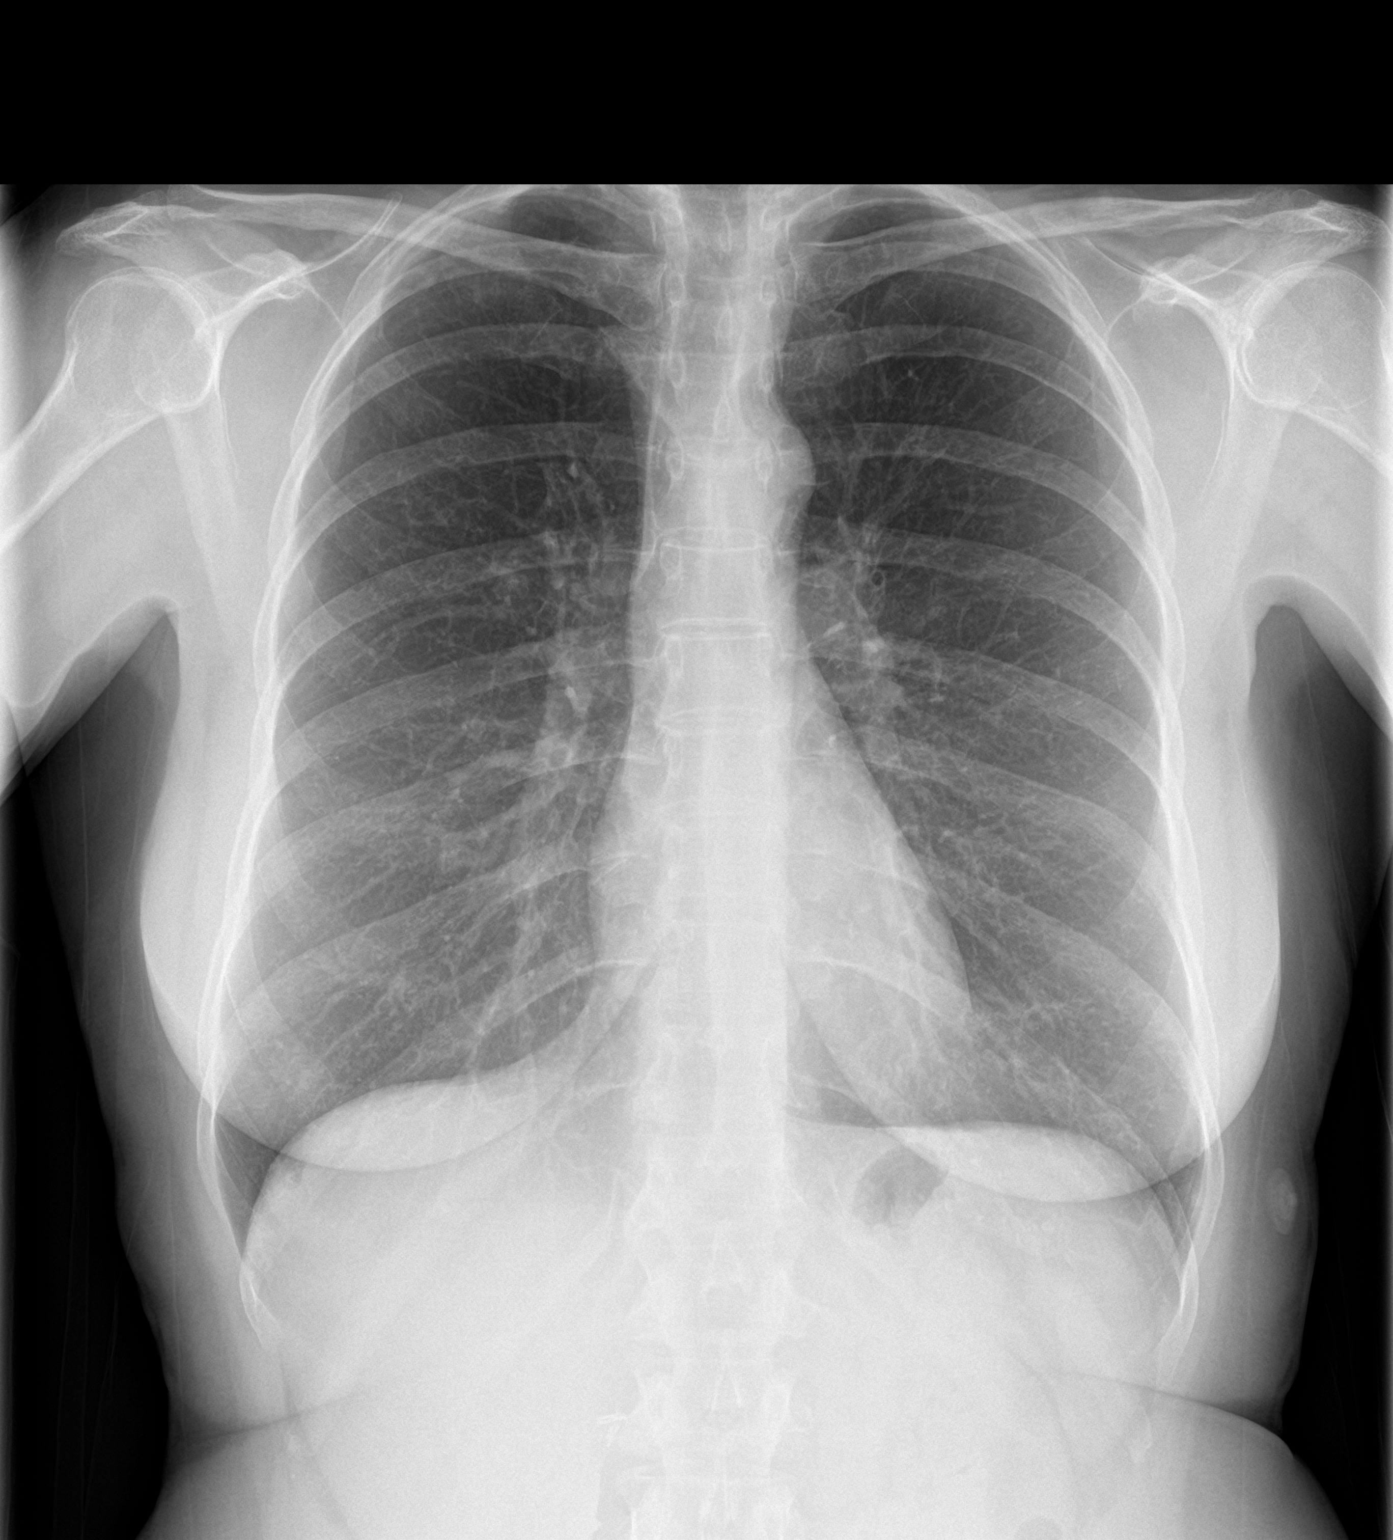

[chest lat]
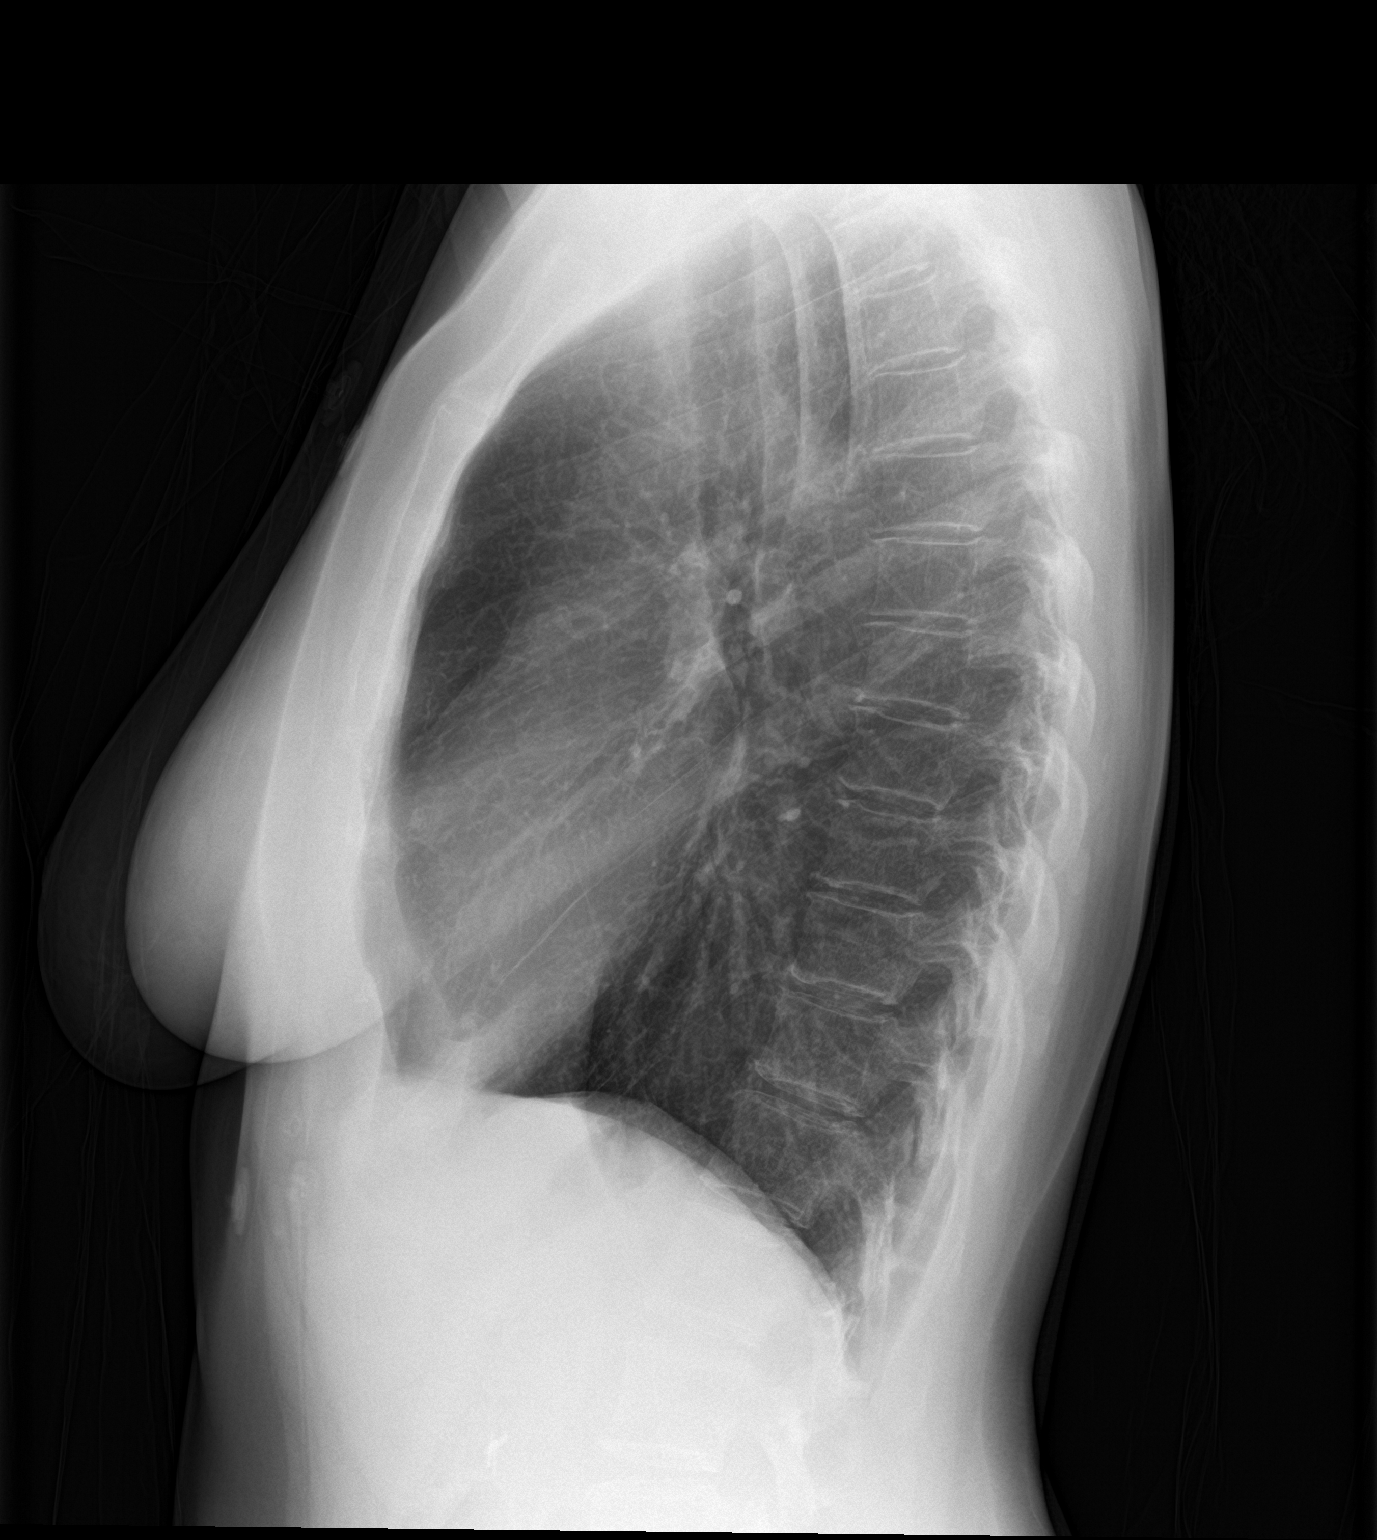

[2 of 2 positions shown; findings below may reference images not displayed]

FINDINGS: The heart size and mediastinal contours are within normal limits. No
focal airspace consolidation, pleural effusion, or pneumothorax. The
visualized skeletal structures are unremarkable.
IMPRESSION: No active cardiopulmonary disease.

## 2022-12-23 ENCOUNTER — Other Ambulatory Visit (HOSPITAL_BASED_OUTPATIENT_CLINIC_OR_DEPARTMENT_OTHER): Payer: Self-pay | Admitting: Obstetrics & Gynecology

## 2022-12-23 DIAGNOSIS — Z780 Asymptomatic menopausal state: Secondary | ICD-10-CM

## 2023-01-02 ENCOUNTER — Encounter (HOSPITAL_BASED_OUTPATIENT_CLINIC_OR_DEPARTMENT_OTHER): Payer: Self-pay | Admitting: Advanced Practice Midwife

## 2023-01-02 ENCOUNTER — Ambulatory Visit (HOSPITAL_BASED_OUTPATIENT_CLINIC_OR_DEPARTMENT_OTHER): Payer: BC Managed Care – PPO | Admitting: Advanced Practice Midwife

## 2023-01-02 ENCOUNTER — Ambulatory Visit: Payer: Commercial Managed Care - PPO

## 2023-01-02 VITALS — BP 141/71 | HR 68 | Ht 62.0 in | Wt 145.0 lb

## 2023-01-02 DIAGNOSIS — N951 Menopausal and female climacteric states: Secondary | ICD-10-CM | POA: Diagnosis not present

## 2023-01-02 DIAGNOSIS — Z01419 Encounter for gynecological examination (general) (routine) without abnormal findings: Secondary | ICD-10-CM

## 2023-01-02 MED ORDER — ESTRADIOL 0.025 MG/24HR TD PTTW: 1.0000 | MEDICATED_PATCH | TRANSDERMAL | 12 refills | Status: DC

## 2023-01-02 NOTE — Progress Notes (Signed)
   Subjective:     Kim Morris is a 62 y.o. female here at [facility] for a routine exam.  Current complaints: none.  Personal health questionnaire reviewed: yes.  Do you have a primary care provider? yes Do you feel safe at home? yes  [place] Visit from 01/02/2023 in [place]  PHQ-2 Total Score 0       Health Maintenance Due  Topic Date Due   HIV Screening  Never done   Hepatitis C Screening  Never done   COLONOSCOPY (Pts 45-69yr Insurance coverage will need to be confirmed)  Never done   Zoster Vaccines- Shingrix (1 of 2) Never done   Lung Cancer Screening  02/06/2020   MAMMOGRAM  01/27/2022   INFLUENZA VACCINE  07/25/2022     Risk factors for chronic health problems: Smoking: cigarettes 1 pack / day for 36 years Alchohol/how much: occasional Pt BMI: Body mass index is 26.52 kg/m.   Gynecologic History Patient's last menstrual period was 11/24/2010. Contraception: status post hysterectomy Last Pap: n/a, s/p hysterectomy in 2011.  Last mammogram: 2021. Results were: normal  Obstetric History OB History  Gravida Para Term Preterm AB Living  '4 3 3 '$ 0 1 3  SAB IAB Ectopic Multiple Live Births  1 0 0 0 3    # Outcome Date GA Lbr Len/2nd Weight Sex Delivery Anes PTL Lv  4 Term 10/22/97    F Vag-Spont   LIV  3 Term 03/10/93    F Vag-Spont   LIV  2 SAB 1993          1 Term 09/03/84    F CS-Unspec   LIV     The following portions of the patient's history were reviewed and updated as appropriate: allergies, current medications, past family history, past medical history, past social history, past surgical history, and problem list.  Review of Systems Pertinent items noted in HPI and remainder of comprehensive ROS otherwise negative.    Objective:   VS reviewed, nursing note reviewed,  Constitutional: well developed, well nourished, no distress HEENT: normocephalic, thyroid without enlargement or mass HEART: RRR, no murmurs  rubs/gallops RESP: clear and equal to auscultation bilaterally in all lobes  Breast Exam:  exam performed: right breast normal without mass, skin or nipple changes or axillary nodes, left breast normal without mass, skin or nipple changes or axillary nodes Abdomen: soft Neuro: alert and oriented x 3 Skin: warm, dry Psych: affect normal Pelvic exam: Bimanual exam:  adnexa without tenderness, enlargement, or mass        Assessment/Plan:   1. Menopausal symptom --Discussed pt symptoms, which are improved, but she stopped patches "[place] a few months ago and did not feel well. --Discussed lowering dose of estrogen and pt agreed to try --If symptoms worsen, she can call/message office and dose can be increased  - estradiol (VIVELLE-DOT) 0.025 MG/24HR; Place 1 patch onto the skin 2 (two) times a week.  Dispense: 8 patch; Refill: 12  2. Well woman exam with routine gynecological exam --bimanual wnl --Mammogram was scheduled today but rescheduled for tomorrow due to weather forecast today --Does not need pap, s/p hysterectomy  --BMD at 685--Colonoscopy 2017, repeat in 10 yrs    Return in about 1 year (around 01/03/2024).   LFatima Blank CNM 2:06 PM

## 2023-01-03 ENCOUNTER — Ambulatory Visit
Admission: RE | Admit: 2023-01-03 | Discharge: 2023-01-03 | Disposition: A | Discharge status: Home / Self Care | Payer: BC Managed Care – PPO | Source: Ambulatory Visit | Attending: Obstetrics & Gynecology | Admitting: Obstetrics & Gynecology

## 2023-01-03 DIAGNOSIS — Z1231 Encounter for screening mammogram for malignant neoplasm of breast: Secondary | ICD-10-CM | POA: Diagnosis not present

## 2023-03-05 DIAGNOSIS — L03011 Cellulitis of right finger: Secondary | ICD-10-CM | POA: Diagnosis not present

## 2023-03-05 DIAGNOSIS — L03012 Cellulitis of left finger: Secondary | ICD-10-CM | POA: Diagnosis not present

## 2023-04-03 DIAGNOSIS — M25572 Pain in left ankle and joints of left foot: Secondary | ICD-10-CM | POA: Diagnosis not present

## 2023-04-03 DIAGNOSIS — R2242 Localized swelling, mass and lump, left lower limb: Secondary | ICD-10-CM | POA: Diagnosis not present

## 2023-04-03 DIAGNOSIS — S8265XA Nondisplaced fracture of lateral malleolus of left fibula, initial encounter for closed fracture: Secondary | ICD-10-CM | POA: Diagnosis not present

## 2023-04-18 DIAGNOSIS — M79621 Pain in right upper arm: Secondary | ICD-10-CM | POA: Diagnosis not present

## 2023-04-18 DIAGNOSIS — S199XXA Unspecified injury of neck, initial encounter: Secondary | ICD-10-CM | POA: Diagnosis not present

## 2023-04-18 DIAGNOSIS — S2222XA Fracture of body of sternum, initial encounter for closed fracture: Secondary | ICD-10-CM | POA: Diagnosis not present

## 2023-04-18 DIAGNOSIS — M542 Cervicalgia: Secondary | ICD-10-CM | POA: Diagnosis not present

## 2023-04-18 DIAGNOSIS — Z7951 Long term (current) use of inhaled steroids: Secondary | ICD-10-CM | POA: Diagnosis not present

## 2023-04-18 DIAGNOSIS — K219 Gastro-esophageal reflux disease without esophagitis: Secondary | ICD-10-CM | POA: Diagnosis not present

## 2023-04-18 DIAGNOSIS — R0602 Shortness of breath: Secondary | ICD-10-CM | POA: Diagnosis not present

## 2023-04-18 DIAGNOSIS — Z041 Encounter for examination and observation following transport accident: Secondary | ICD-10-CM | POA: Diagnosis not present

## 2023-04-18 DIAGNOSIS — M797 Fibromyalgia: Secondary | ICD-10-CM | POA: Diagnosis not present

## 2023-04-18 DIAGNOSIS — M545 Low back pain, unspecified: Secondary | ICD-10-CM | POA: Diagnosis not present

## 2023-04-18 DIAGNOSIS — S2220XA Unspecified fracture of sternum, initial encounter for closed fracture: Secondary | ICD-10-CM | POA: Diagnosis not present

## 2023-04-18 DIAGNOSIS — S3991XA Unspecified injury of abdomen, initial encounter: Secondary | ICD-10-CM | POA: Diagnosis not present

## 2023-04-18 DIAGNOSIS — Z79891 Long term (current) use of opiate analgesic: Secondary | ICD-10-CM | POA: Diagnosis not present

## 2023-04-18 DIAGNOSIS — T1490XA Injury, unspecified, initial encounter: Secondary | ICD-10-CM | POA: Diagnosis not present

## 2023-04-18 DIAGNOSIS — M25511 Pain in right shoulder: Secondary | ICD-10-CM | POA: Diagnosis not present

## 2023-04-18 DIAGNOSIS — S2509XA Other specified injury of thoracic aorta, initial encounter: Secondary | ICD-10-CM | POA: Diagnosis not present

## 2023-04-18 DIAGNOSIS — G8911 Acute pain due to trauma: Secondary | ICD-10-CM | POA: Diagnosis not present

## 2023-04-18 DIAGNOSIS — Y92410 Unspecified street and highway as the place of occurrence of the external cause: Secondary | ICD-10-CM | POA: Diagnosis not present

## 2023-04-18 DIAGNOSIS — E7800 Pure hypercholesterolemia, unspecified: Secondary | ICD-10-CM | POA: Diagnosis not present

## 2023-04-18 DIAGNOSIS — F17200 Nicotine dependence, unspecified, uncomplicated: Secondary | ICD-10-CM | POA: Diagnosis not present

## 2023-04-18 DIAGNOSIS — M546 Pain in thoracic spine: Secondary | ICD-10-CM | POA: Diagnosis not present

## 2023-04-18 DIAGNOSIS — J449 Chronic obstructive pulmonary disease, unspecified: Secondary | ICD-10-CM | POA: Diagnosis not present

## 2023-04-18 DIAGNOSIS — Z79899 Other long term (current) drug therapy: Secondary | ICD-10-CM | POA: Diagnosis not present

## 2023-04-19 DIAGNOSIS — S2220XA Unspecified fracture of sternum, initial encounter for closed fracture: Secondary | ICD-10-CM | POA: Diagnosis not present

## 2023-05-03 DIAGNOSIS — T1490XA Injury, unspecified, initial encounter: Secondary | ICD-10-CM | POA: Diagnosis not present

## 2023-07-17 DIAGNOSIS — H6501 Acute serous otitis media, right ear: Secondary | ICD-10-CM | POA: Diagnosis not present

## 2023-07-17 DIAGNOSIS — Z0000 Encounter for general adult medical examination without abnormal findings: Secondary | ICD-10-CM | POA: Diagnosis not present

## 2023-07-17 DIAGNOSIS — R5383 Other fatigue: Secondary | ICD-10-CM | POA: Diagnosis not present

## 2023-07-17 DIAGNOSIS — E559 Vitamin D deficiency, unspecified: Secondary | ICD-10-CM | POA: Diagnosis not present

## 2023-07-17 DIAGNOSIS — E7800 Pure hypercholesterolemia, unspecified: Secondary | ICD-10-CM | POA: Diagnosis not present

## 2023-07-17 DIAGNOSIS — J309 Allergic rhinitis, unspecified: Secondary | ICD-10-CM | POA: Diagnosis not present

## 2023-07-17 DIAGNOSIS — R051 Acute cough: Secondary | ICD-10-CM | POA: Diagnosis not present

## 2023-07-30 DIAGNOSIS — J3089 Other allergic rhinitis: Secondary | ICD-10-CM | POA: Diagnosis not present

## 2023-07-30 DIAGNOSIS — J432 Centrilobular emphysema: Secondary | ICD-10-CM | POA: Diagnosis not present

## 2023-07-30 DIAGNOSIS — Z0000 Encounter for general adult medical examination without abnormal findings: Secondary | ICD-10-CM | POA: Diagnosis not present

## 2023-07-30 DIAGNOSIS — Z23 Encounter for immunization: Secondary | ICD-10-CM | POA: Diagnosis not present

## 2023-07-30 DIAGNOSIS — I2584 Coronary atherosclerosis due to calcified coronary lesion: Secondary | ICD-10-CM | POA: Diagnosis not present

## 2023-07-30 DIAGNOSIS — Z780 Asymptomatic menopausal state: Secondary | ICD-10-CM | POA: Diagnosis not present

## 2023-08-17 DIAGNOSIS — J309 Allergic rhinitis, unspecified: Secondary | ICD-10-CM | POA: Diagnosis not present

## 2023-08-17 DIAGNOSIS — M26609 Unspecified temporomandibular joint disorder, unspecified side: Secondary | ICD-10-CM | POA: Diagnosis not present

## 2023-08-17 DIAGNOSIS — H938X1 Other specified disorders of right ear: Secondary | ICD-10-CM | POA: Diagnosis not present

## 2023-09-22 DIAGNOSIS — Z720 Tobacco use: Secondary | ICD-10-CM | POA: Diagnosis not present

## 2023-11-05 DIAGNOSIS — Z23 Encounter for immunization: Secondary | ICD-10-CM | POA: Diagnosis not present

## 2023-12-03 ENCOUNTER — Encounter (HOSPITAL_BASED_OUTPATIENT_CLINIC_OR_DEPARTMENT_OTHER): Payer: Self-pay | Admitting: Obstetrics & Gynecology

## 2023-12-03 ENCOUNTER — Ambulatory Visit (INDEPENDENT_AMBULATORY_CARE_PROVIDER_SITE_OTHER): Payer: BC Managed Care – PPO | Admitting: Obstetrics & Gynecology

## 2023-12-03 VITALS — BP 142/80 | HR 74 | Ht 62.0 in | Wt 150.4 lb

## 2023-12-03 DIAGNOSIS — Z87891 Personal history of nicotine dependence: Secondary | ICD-10-CM

## 2023-12-03 DIAGNOSIS — Z01419 Encounter for gynecological examination (general) (routine) without abnormal findings: Secondary | ICD-10-CM | POA: Diagnosis not present

## 2023-12-03 DIAGNOSIS — Z1211 Encounter for screening for malignant neoplasm of colon: Secondary | ICD-10-CM | POA: Diagnosis not present

## 2023-12-03 DIAGNOSIS — Z1212 Encounter for screening for malignant neoplasm of rectum: Secondary | ICD-10-CM

## 2023-12-03 DIAGNOSIS — Z1231 Encounter for screening mammogram for malignant neoplasm of breast: Secondary | ICD-10-CM | POA: Diagnosis not present

## 2023-12-03 DIAGNOSIS — Z808 Family history of malignant neoplasm of other organs or systems: Secondary | ICD-10-CM

## 2023-12-03 DIAGNOSIS — R030 Elevated blood-pressure reading, without diagnosis of hypertension: Secondary | ICD-10-CM

## 2023-12-03 DIAGNOSIS — N951 Menopausal and female climacteric states: Secondary | ICD-10-CM

## 2023-12-03 NOTE — Patient Instructions (Signed)
 Non hormonal  Coconut oil twice weekly Replens vaginal moisturizer twice weekly Revaree - vaginal hyaluronic acid suppository twice weekly Vit E vaginal suppositories  Hyaluronic acid products -- Lorrin Mais is an [place] product that is a lubricant   Hormonal  Estradiol cream -- twice weekly (prescription)
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
## 2023-12-04 LAB — HEMOGLOBIN A1C
Est. average glucose Bld gHb Est-mCnc: 111 mg/dL
Hgb A1c MFr Bld: 5.5 % (ref 4.8–5.6)

## 2023-12-13 ENCOUNTER — Encounter: Payer: Self-pay | Admitting: Physician Assistant

## 2024-01-08 ENCOUNTER — Ambulatory Visit (HOSPITAL_BASED_OUTPATIENT_CLINIC_OR_DEPARTMENT_OTHER): Admission: RE | Admit: 2024-01-08 | Payer: BC Managed Care – PPO | Source: Ambulatory Visit | Admitting: Radiology

## 2024-01-10 ENCOUNTER — Encounter: Payer: Self-pay | Admitting: Dermatology

## 2024-01-10 ENCOUNTER — Ambulatory Visit (HOSPITAL_BASED_OUTPATIENT_CLINIC_OR_DEPARTMENT_OTHER)
Admission: RE | Admit: 2024-01-10 | Discharge: 2024-01-10 | Disposition: A | Discharge status: Home / Self Care | Payer: Managed Care, Other (non HMO) | Source: Ambulatory Visit | Attending: Physician Assistant | Admitting: Physician Assistant

## 2024-01-10 ENCOUNTER — Ambulatory Visit: Payer: Managed Care, Other (non HMO) | Admitting: Dermatology

## 2024-01-10 ENCOUNTER — Encounter (HOSPITAL_BASED_OUTPATIENT_CLINIC_OR_DEPARTMENT_OTHER): Payer: Self-pay | Admitting: Radiology

## 2024-01-10 VITALS — BP 139/75 | HR 88

## 2024-01-10 DIAGNOSIS — Z808 Family history of malignant neoplasm of other organs or systems: Secondary | ICD-10-CM

## 2024-01-10 DIAGNOSIS — D492 Neoplasm of unspecified behavior of bone, soft tissue, and skin: Secondary | ICD-10-CM

## 2024-01-10 DIAGNOSIS — L814 Other melanin hyperpigmentation: Secondary | ICD-10-CM

## 2024-01-10 DIAGNOSIS — L578 Other skin changes due to chronic exposure to nonionizing radiation: Secondary | ICD-10-CM | POA: Diagnosis not present

## 2024-01-10 DIAGNOSIS — B079 Viral wart, unspecified: Secondary | ICD-10-CM

## 2024-01-10 DIAGNOSIS — Z1283 Encounter for screening for malignant neoplasm of skin: Secondary | ICD-10-CM

## 2024-01-10 DIAGNOSIS — Z1231 Encounter for screening mammogram for malignant neoplasm of breast: Secondary | ICD-10-CM | POA: Insufficient documentation

## 2024-01-10 DIAGNOSIS — D485 Neoplasm of uncertain behavior of skin: Secondary | ICD-10-CM

## 2024-01-10 DIAGNOSIS — L6689 Other cicatricial alopecia: Secondary | ICD-10-CM

## 2024-01-10 DIAGNOSIS — W908XXA Exposure to other nonionizing radiation, initial encounter: Secondary | ICD-10-CM | POA: Diagnosis not present

## 2024-01-10 DIAGNOSIS — L821 Other seborrheic keratosis: Secondary | ICD-10-CM | POA: Diagnosis not present

## 2024-01-10 DIAGNOSIS — L930 Discoid lupus erythematosus: Secondary | ICD-10-CM

## 2024-01-10 DIAGNOSIS — D1801 Hemangioma of skin and subcutaneous tissue: Secondary | ICD-10-CM

## 2024-01-10 DIAGNOSIS — D229 Melanocytic nevi, unspecified: Secondary | ICD-10-CM

## 2024-01-10 NOTE — Progress Notes (Signed)
New Patient Visit   Subjective  Kim Morris is a 63 y.o. female who presents for the following: Skin Cancer Screening and Full Body Skin Exam. No hx of skin cancer. Sister has MM.  The patient presents for Total-Body Skin Exam (TBSE) for skin cancer screening and mole check. The patient has spots, moles and lesions to be evaluated, some may be new or changing.  The following portions of the chart were reviewed this encounter and updated as appropriate: medications, allergies, medical history  Review of Systems:  No other skin or systemic complaints except as noted in HPI or Assessment and Plan.  Objective  Well appearing patient in no apparent distress; mood and affect are within normal limits.  A full examination was performed including scalp, head, eyes, ears, nose, lips, neck, chest, axillae, abdomen, back, buttocks, bilateral upper extremities, bilateral lower extremities, hands, feet, fingers, toes, fingernails, and toenails. All findings within normal limits unless otherwise noted below.   Relevant physical exam findings are noted in the Assessment and Plan.  Right 3rd Hyponychium of Toe Verrucous papules  Left Hip, Left Parotid Area Inflamed stuck on papules    Assessment & Plan   SKIN CANCER SCREENING PERFORMED TODAY.  ACTINIC DAMAGE - Chronic condition, secondary to cumulative UV/sun exposure - diffuse scaly erythematous macules with underlying dyspigmentation - Recommend daily broad spectrum sunscreen SPF 30+ to sun-exposed areas, reapply every 2 hours as needed.  - Staying in the shade or wearing long sleeves, sun glasses (UVA+UVB protection) and wide brim hats (4-inch brim around the entire circumference of the hat) are also recommended for sun protection.  - Call for new or changing lesions.  LENTIGINES, SEBORRHEIC KERATOSES, HEMANGIOMAS - Benign normal skin lesions - Benign-appearing - Call for any changes  MELANOCYTIC NEVI - Tan-brown and/or  pink-flesh-colored symmetric macules and papules - Benign appearing on exam today - Observation - Call clinic for new or changing moles - Recommend daily use of broad spectrum spf 30+ sunscreen to sun-exposed areas.   Neoplasm on the Right 3rd toe-Dds Onychopapilloma vs Wart vs SCCIS vs other- acute, not at treatment goal - Focal hypertrophic keratotic lesion on the distal 3rd toe The patient presents with a lesion on the third right toe, which is concerning for a wart, onychopapilloma, or potentially squamous cell carcinoma (SCC). The lesion is raised, hyperkeratotic, and located near the nail bed, with clinical features that could overlap between these differential diagnoses. During the consultation, we discussed the characteristics of each possibility: warts are commonly caused by HPV and are typically benign, onychopapillomas are benign growths arising from the nail matrix, while SCC is a malignant skin cancer that requires prompt treatment. Given the appearance and location of the lesion, further diagnostic evaluation was recommended, but to begin management, we will proceed with cryotherapy today to destroy the lesion and allow for further assessment of its response. The patient was informed about the potential need for biopsy if the lesion does not resolve or if there are concerns about malignancy. Follow-up was scheduled in 4 weeks to monitor the lesion's response to cryotherapy and to determine if additional interventions, such as biopsy, may be necessary. The patient was also advised to monitor for any changes in the lesion's appearance or new symptoms and to return promptly if any concerning signs arise.  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 The patient has previously been treated with topical steroids with some minimal response. During the consultation, I discussed the differential diagnosis, which includes discoid lupus erythematosus (DLE), central centrifugal cicatricial alopecia (CCCA), psoriasis, and other potential causes of scarring alopecia. I explained that DLE and CCCA can both cause permanent hair loss through scarring of the hair follicles, while psoriasis may present with scaling and erythema but typically does not result in permanent scarring alopecia unless associated with other factors. We discussed the importance of obtaining a biopsy today to obtain a definitive diagnosis and guide further management. The patient was informed about the biopsy procedure, including the potential risks and benefits, and was reassured that the results will help direct appropriate treatment. Management may involve topical or systemic immunosuppressive treatments depending on the final diagnosis. The patient was advised to continue avoiding any irritants to the scalp and to follow up after the biopsy for review of results and to plan further treatment. VIRAL WARTS, UNSPECIFIED TYPE Right 3rd Hyponychium of Toe Destruction of lesion - Right 3rd Hyponychium of Toe Complexity: extensive   Destruction method: cryotherapy   Informed consent: discussed and consent obtained   Timeout:  patient name, date of birth, surgical site, and procedure verified Lesion destroyed using liquid nitrogen: Yes   Region frozen until ice ball extended beyond lesion: Yes   Cryotherapy cycles:  2 Outcome: patient tolerated procedure well with no complications   Post-procedure details: wound care instructions given   SEBORRHEIC KERATOSIS (2) Left Hip, Left Parotid Area Destruction of lesion - Left Hip, Left Parotid Area Complexity: simple   Destruction method: cryotherapy   Timeout:  patient name, date of birth, surgical site, and procedure  verified Anesthesia: the lesion was anesthetized in a standard fashion   Anesthetic:  1% lidocaine w/ epinephrine 1-100,000 buffered w/ 8.4% NaHCO3 Lesion destroyed using liquid nitrogen: Yes   Region frozen until ice ball extended beyond lesion: Yes   Cryotherapy cycles:  2 Outcome: patient tolerated procedure well with no complications   Post-procedure details: wound care instructions given   NEOPLASM OF UNCERTAIN BEHAVIOR OF SKIN Left Parietal Scalp Skin / nail biopsy Type of biopsy: tangential   Informed consent: discussed and consent obtained   Timeout: patient name, date of birth, surgical site, and procedure verified   Procedure prep:  Patient was prepped and draped in usual sterile fashion Prep type:  Isopropyl alcohol Anesthesia: the lesion was anesthetized in a standard fashion   Anesthetic:  1% lidocaine w/ epinephrine 1-100,000 buffered w/ 8.4% NaHCO3 Hemostasis achieved with: aluminum chloride   Post-procedure details: sterile dressing applied and wound care instructions given   Dressing type: bandage and petrolatum   MULTIPLE BENIGN NEVI   ACTINIC SKIN DAMAGE   LENTIGINES   SEBORRHEIC KERATOSES   CHERRY ANGIOMA     Return in about 4 weeks (around 02/07/2024).  Dominga Ferry, Surg Tech III, am acting as scribe for Gwenith Daily, MD.   Documentation: I have reviewed the above documentation for accuracy and completeness, and I agree with the above.  Gwenith Daily, MD

## 2024-01-10 NOTE — Patient Instructions (Addendum)
Important Information  Due to recent changes in healthcare laws, you may see results of your pathology and/or laboratory studies on MyChart before the doctors have had a chance to review them. We understand that in some cases there may be results that are confusing or concerning to you. Please understand that not all results are received at the same time and often the doctors may need to interpret multiple results in order to provide you with the best plan of care or course of treatment. Therefore, we ask that you please give [place] 2 business days to thoroughly review all your results before contacting the office for clarification. Should we see a critical lab result, you will be contacted sooner.   If You Need Anything After Your Visit  If you have any questions or concerns for your doctor, please call our main line at [phone] If no one answers, please leave a voicemail as directed and we will return your call as soon as possible. Messages left after 4 pm will be answered the following business day.   You may also send [place] a message via MyChart. We typically respond to MyChart messages within 1-2 business days.  For prescription refills, please ask your pharmacy to contact our office. Our fax number is [phone].  If you have an urgent issue when the clinic is closed that cannot wait until the next business day, you can page your doctor at the number below.    Please note that while we do our best to be available for urgent issues outside of office hours, we are not available 24/7.   If you have an urgent issue and are unable to reach [place], you may choose to seek medical care at your doctor's office, retail clinic, urgent care center, or emergency room.  If you have a medical emergency, please immediately call 911 or go to the emergency department. In the event of inclement weather, please call our main line at [phone] for an update on the status of any delays or  closures.  Dermatology Medication Tips: Please keep the boxes that topical medications come in in order to help keep track of the instructions about where and how to use these. Pharmacies typically print the medication instructions only on the boxes and not directly on the medication tubes.   If your medication is too expensive, please contact our office at [phone] or send [place] a message through MyChart.   We are unable to tell what your co-pay for medications will be in advance as this is different depending on your insurance coverage. However, we may be able to find a substitute medication at lower cost or fill out paperwork to get insurance to cover a needed medication.   If a prior authorization is required to get your medication covered by your insurance company, please allow [place] 1-2 business days to complete this process.  Drug prices often vary depending on where the prescription is filled and some pharmacies may offer cheaper prices.  The website www.goodrx.com contains coupons for medications through different pharmacies. The prices here do not account for what the cost may be with help from insurance (it may be cheaper with your insurance), but the website can give you the price if you did not use any insurance.  - You can print the associated coupon and take it with your prescription to the pharmacy.  - You may also stop by our office during regular business hours and pick up a GoodRx coupon card.  - If  you need your prescription sent electronically to a different pharmacy, notify our office through [facility] or by phone at [phone]    Skin Education :   I counseled the patient regarding the following: Sun screen (SPF 30 or greater) should be applied during peak UV exposure (between 10am and 2pm) and reapplied after exercise or swimming.  The ABCDEs of melanoma were reviewed with the patient, and the importance of monthly self-examination of moles was emphasized.  Should any moles change in shape or color, or itch, bleed or burn, pt will contact our office for evaluation sooner then their interval appointment.  Plan: Sunscreen Recommendations I recommended a broad spectrum sunscreen with a SPF of 30 or higher. I explained that SPF 30 sunscreens block approximately 97 percent of the sun's harmful rays. Sunscreens should be applied at least 15 minutes prior to expected sun exposure and then every 2 hours after that as long as sun exposure continues. If swimming or exercising sunscreen should be reapplied every 45 minutes to an hour after getting wet or sweating. One ounce, or the equivalent of a shot glass full of sunscreen, is adequate to protect the skin not covered by a bathing suit. I also recommended a lip balm with a sunscreen as well. Sun protective clothing can be used in lieu of sunscreen but must be worn the entire time you are exposed to the sun's rays. Liquid nitrogen was applied for 10-12 seconds to the skin lesion and the expected blistering or scabbing reaction explained. Do not pick at the area. Patient reminded to expect hypopigmented scars from the procedure. Return if lesion fails to fully resolve. For areas treated with Liquid Nitrogen:  Keep clean with soap and water.  Apply Vaseline or Aquaphor twice daily.  Patient Handout: Wound Care for Skin Biopsy Site  Taking Care of Your Skin Biopsy Site  Proper care of the biopsy site is essential for promoting healing and minimizing scarring. This handout provides instructions on how to care for your biopsy site to ensure optimal recovery.  1. Cleaning the Wound:  Clean the biopsy site daily with gentle soap and water. Gently pat the area dry with a clean, soft towel. Avoid harsh scrubbing or rubbing the area, as this can irritate the skin and delay healing.  2. Applying Aquaphor and Bandage:  After cleaning the wound, apply a thin layer of Aquaphor ointment to the biopsy site. Cover the area  with a sterile bandage to protect it from dirt, bacteria, and friction. Change the bandage daily or as needed if it becomes soiled or wet.  3. Continued Care for One Week:  Repeat the cleaning, Aquaphor application, and bandaging process daily for one week following the biopsy procedure. Keeping the wound clean and moist during this initial healing period will help prevent infection and promote optimal healing.  4. Massaging Aquaphor into the Area:  ---After one week, discontinue the use of bandages but continue to apply Aquaphor to the biopsy site. ----Gently massage the Aquaphor into the area using circular motions. ---Massaging the skin helps to promote circulation and prevent the formation of scar tissue.   Additional Tips:  Avoid exposing the biopsy site to direct sunlight during the healing process, as this can cause hyperpigmentation or worsen scarring. If you experience any signs of infection, such as increased redness, swelling, warmth, or drainage from the wound, contact your healthcare provider immediately. Follow any additional instructions provided by your healthcare provider for caring for the biopsy site and  managing any discomfort. Conclusion:  Taking proper care of your skin biopsy site is crucial for ensuring optimal healing and minimizing scarring. By following these instructions for cleaning, applying Aquaphor, and massaging the area, you can promote a smooth and successful recovery. If you have any questions or concerns about caring for your biopsy site, don't hesitate to contact your healthcare provider for guidance.   Patient Handout: Wound Care for Skin Biopsy Site  Taking Care of Your Skin Biopsy Site  Proper care of the biopsy site is essential for promoting healing and minimizing scarring. This handout provides instructions on how to care for your biopsy site to ensure optimal recovery.  1. Cleaning the Wound:  Clean the biopsy site daily with gentle soap and  water. Gently pat the area dry with a clean, soft towel. Avoid harsh scrubbing or rubbing the area, as this can irritate the skin and delay healing.  2. Applying Aquaphor and Bandage:  After cleaning the wound, apply a thin layer of Aquaphor ointment to the biopsy site. Cover the area with a sterile bandage to protect it from dirt, bacteria, and friction. Change the bandage daily or as needed if it becomes soiled or wet.  3. Continued Care for One Week:  Repeat the cleaning, Aquaphor application, and bandaging process daily for one week following the biopsy procedure. Keeping the wound clean and moist during this initial healing period will help prevent infection and promote optimal healing.  4. Massaging Aquaphor into the Area:  ---After one week, discontinue the use of bandages but continue to apply Aquaphor to the biopsy site. ----Gently massage the Aquaphor into the area using circular motions. ---Massaging the skin helps to promote circulation and prevent the formation of scar tissue.   Additional Tips:  Avoid exposing the biopsy site to direct sunlight during the healing process, as this can cause hyperpigmentation or worsen scarring. If you experience any signs of infection, such as increased redness, swelling, warmth, or drainage from the wound, contact your healthcare provider immediately. Follow any additional instructions provided by your healthcare provider for caring for the biopsy site and managing any discomfort. Conclusion:  Taking proper care of your skin biopsy site is crucial for ensuring optimal healing and minimizing scarring. By following these instructions for cleaning, applying Aquaphor, and massaging the area, you can promote a smooth and successful recovery. If you have any questions or concerns about caring for your biopsy site, don't hesitate to contact your healthcare provider for guidance.

## 2024-01-11 LAB — SURGICAL PATHOLOGY

## 2024-01-15 ENCOUNTER — Other Ambulatory Visit: Payer: Self-pay | Admitting: Dermatology

## 2024-01-15 DIAGNOSIS — L930 Discoid lupus erythematosus: Secondary | ICD-10-CM

## 2024-01-15 MED ORDER — CLOBETASOL PROPIONATE 0.05 % EX SOLN: 1.0000 | Freq: Two times a day (BID) | CUTANEOUS | 4 refills | Status: DC

## 2024-01-21 ENCOUNTER — Ambulatory Visit (INDEPENDENT_AMBULATORY_CARE_PROVIDER_SITE_OTHER): Payer: BC Managed Care – PPO | Admitting: Dermatology

## 2024-01-21 ENCOUNTER — Encounter: Payer: Self-pay | Admitting: Dermatology

## 2024-01-21 DIAGNOSIS — L930 Discoid lupus erythematosus: Secondary | ICD-10-CM | POA: Diagnosis not present

## 2024-01-21 DIAGNOSIS — Z79899 Other long term (current) drug therapy: Secondary | ICD-10-CM | POA: Diagnosis not present

## 2024-01-21 MED ORDER — TRIAMCINOLONE ACETONIDE 10 MG/ML IJ SUSP
10.0000 mg | Freq: Once | INTRAMUSCULAR | Status: AC
  Administered 2024-01-21: 10 mg

## 2024-01-21 NOTE — Progress Notes (Signed)
Follow-Up Visit   Subjective  Kim Morris is a 63 y.o. female who presents for the following: follow up s/p scalp biopsy showing DLE. 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  The following portions of the chart were reviewed this encounter and updated as appropriate: medications, allergies, medical history  Review of Systems:  No other skin or systemic complaints except as noted in HPI or Assessment and Plan.  Objective  Well appearing patient in no apparent distress; mood and affect are within normal limits.    A focused examination was performed of the following areas:  Scalp  Relevant exam findings are noted in the Assessment and Plan.  Mid Parietal Scalp Procedure Note Intralesional Injection  Location: scalp  Informed Consent: Discussed risks (infection, pain, bleeding, bruising, thinning of the skin, loss of skin pigment, lack of resolution, and recurrence of lesion) and benefits of the procedure, as well as the alternatives. Informed consent was obtained. Preparation: The area was prepared a standard fashion.  Anesthesia: N/A  Procedure Details: An intralesional injection was performed with Kenalog 5 mg/cc. 1.5 cc in total were injected. NDC #: [address]-95 Exp: 03/24/25  Total number of injections: 10  Plan: The patient was instructed on post-op care. Recommend OTC analgesia as needed for pain.    Assessment & Plan   Encounter for Removal of Sutures from biopsy on scalp - Incision site at the scalp is clean, dry and intact - Wound cleansed, sutures removed, wound cleansed. - Discussed pathology results showing discoid lupus - Scars remodel for a full year. - Patient advised to call with any concerns or if they notice any new or changing lesions.   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 It leads to the  formation of red, scaly patches that may be accompanied by hair thinning or hair loss. DLE on the scalp can cause permanent scarring and follicle damage if left untreated, which may result in irreversible hair loss. It is important to protect the affected areas from further sun exposure, as ultraviolet (UV) light can exacerbate symptoms. Treatment options typically include topical corticosteroids to reduce inflammation and immune system activity, as well as other immunosuppressive medications if necessary. In some cases, antimalarial drugs like hydroxychloroquine may be used to manage symptoms. Gentle hair care practices are advised to minimize irritation and prevent further damage. Regular follow-up with a dermatologist is essential for monitoring the condition and adjusting treatment as needed. Early intervention can help prevent scarring and improve the chances of hair regrowth. - triamcinolone acetonide (KENALOG) 5 MG/ML injection- 1.5 cc  High Risk Medication Counseling Hydroxychloroquine is often prescribed as part of the treatment regimen for Discoid Lupus Erythematosus (DLE), particularly when the condition is moderate to severe or when topical treatments are insufficient. This medication works by modulating the immune system to reduce inflammation and the overactive immune response seen in DLE. It may help control skin lesions, prevent flare-ups, and potentially limit scarring or hair loss. However, it's important to take hydroxychloroquine exactly as prescribed, typically in daily doses, and follow up regularly with your healthcare provider for monitoring. Routine eye exams are necessary, as hydroxychloroquine can have ocular side effects, including retinopathy, especially with long-term use. Common side effects may include gastrointestinal upset, headaches, or skin pigmentation changes, but these should be discussed with your healthcare provider if they occur. While hydroxychloroquine can be very  effective in  managing DLE, it is not a cure, and ongoing treatment and monitoring are important for optimal management of the condition. Patient would like to wait to see her ophthalmologist prior to starting hydroxychloroquine DISCOID LUPUS ERYTHEMATOSUS Mid Parietal Scalp Related Medications triamcinolone acetonide (KENALOG) 10 MG/ML injection 10 mg   Return in about 6 weeks (around 03/03/2024) for f/u lupus of scalp/ilk injection. I, Tillie Fantasia, CMA, am acting as scribe for Gwenith Daily, MD.   Documentation: I have reviewed the above documentation for accuracy and completeness, and I agree with the above.  Gwenith Daily, MD

## 2024-01-21 NOTE — Patient Instructions (Signed)
Important Information   Due to recent changes in healthcare laws, you may see results of your pathology and/or laboratory studies on MyChart before the doctors have had a chance to review them. We understand that in some cases there may be results that are confusing or concerning to you. Please understand that not all results are received at the same time and often the doctors may need to interpret multiple results in order to provide you with the best plan of care or course of treatment. Therefore, we ask that you please give [place] 2 business days to thoroughly review all your results before contacting the office for clarification. Should we see a critical lab result, you will be contacted sooner.     If You Need Anything After Your Visit   If you have any questions or concerns for your doctor, please call our main line at [phone]. If no one answers, please leave a voicemail as directed and we will return your call as soon as possible. Messages left after 4 pm will be answered the following business day.    You may also send [place] a message via MyChart. We typically respond to MyChart messages within 1-2 business days.  For prescription refills, please ask your pharmacy to contact our office. Our fax number is [phone].  If you have an urgent issue when the clinic is closed that cannot wait until the next business day, you can page your doctor at the number below.     Please note that while we do our best to be available for urgent issues outside of office hours, we are not available 24/7.    If you have an urgent issue and are unable to reach [place], you may choose to seek medical care at your doctor's office, retail clinic, urgent care center, or emergency room.   If you have a medical emergency, please immediately call 911 or go to the emergency department. In the event of inclement weather, please call our main line at [phone] for an update on the status of any delays or  closures.  Dermatology Medication Tips: Please keep the boxes that topical medications come in in order to help keep track of the instructions about where and how to use these. Pharmacies typically print the medication instructions only on the boxes and not directly on the medication tubes.   If your medication is too expensive, please contact our office at [phone] or send [place] a message through MyChart.    We are unable to tell what your co-pay for medications will be in advance as this is different depending on your insurance coverage. However, we may be able to find a substitute medication at lower cost or fill out paperwork to get insurance to cover a needed medication.    If a prior authorization is required to get your medication covered by your insurance company, please allow [place] 1-2 business days to complete this process.   Drug prices often vary depending on where the prescription is filled and some pharmacies may offer cheaper prices.   The website www.goodrx.com contains coupons for medications through different pharmacies. The prices here do not account for what the cost may be with help from insurance (it may be cheaper with your insurance), but the website can give you the price if you did not use any insurance.  - You can print the associated coupon and take it with your prescription to the pharmacy.  - You may also stop by our office during regular  business hours and pick up a GoodRx coupon card.  - If you need your prescription sent electronically to a different pharmacy, notify our office through [facility] or by phone at [phone]

## 2024-03-06 ENCOUNTER — Ambulatory Visit: Payer: BC Managed Care – PPO | Admitting: Dermatology

## 2024-03-31 ENCOUNTER — Encounter: Payer: Self-pay | Admitting: Dermatology

## 2024-03-31 ENCOUNTER — Ambulatory Visit: Admitting: Dermatology

## 2024-03-31 VITALS — BP 117/70 | HR 96

## 2024-03-31 DIAGNOSIS — D492 Neoplasm of unspecified behavior of bone, soft tissue, and skin: Secondary | ICD-10-CM

## 2024-03-31 DIAGNOSIS — L308 Other specified dermatitis: Secondary | ICD-10-CM

## 2024-03-31 DIAGNOSIS — L930 Discoid lupus erythematosus: Secondary | ICD-10-CM | POA: Diagnosis not present

## 2024-03-31 MED ORDER — TRIAMCINOLONE ACETONIDE 10 MG/ML IJ SUSP
10.0000 mg | Freq: Once | INTRAMUSCULAR | Status: AC
Start: 2024-03-31 — End: ?

## 2024-03-31 NOTE — Progress Notes (Signed)
 Follow-Up Visit   Subjective  Kim Morris is a 63 y.o. 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 Bx 01/10/2024. Left parietal scalp, left posterior scalp.  Has had injections of ILK 10 mg/ml 01/21/2024. States condition is asymptomatic.  Used Clobetasol solution as directed but has not used lately because there is no irritation.  Patient deferred treatment with hydroxychloroquine.  Noticed last summer that red spots appeared after sun exposure.    Recheck R-3 distal toe. Tx with LN2 01/10/2024. DDX Onychopapilloma vs Wart vs SCCIS vs other- acute. Has improved but still has residual lesion.  The following portions of the chart were reviewed this encounter and updated as appropriate: medications, allergies, medical history  Review of Systems:  No other skin or systemic complaints except as noted in HPI or Assessment and Plan.  Objective  Well appearing patient in no apparent distress; mood and affect are within normal limits.  A focused examination was performed of the following areas: Scalp, R-3 toe  Relevant physical exam findings are noted in the Assessment and Plan.  left parietal scalp, vertex scalp, occiptal scalp Scattered pink inflamed lesions at vertex scalp, areas of hair loss at left parietal scalp and occipital scalp. 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 Current symptoms include redness and hair loss in active spots. Patient uses topical clobetasol when itchy, but some redness persists, indicating subacute inflammation. Sun exposure causes red spots. Dry eyes and mouth reported, suggesting possible systemic involvement. ANA test not yet performed. - Plan:    Apply topical clobetasol once daily to affected areas    Start Plaquenil (hydroxychloroquine) pending eye exam  results    Perform ANA test and further pattern testing if positive    Use mineral sunscreens with zinc oxide or titanium dioxide    Avoid NSAIDs; use Tylenol for pain relief    Use DHS Zinc anti-inflammatory shampoo    Administer scalp injections    Follow up every four months for monitoring and comparison of progress    Complete eye exam scheduled for April 10, 2024  2. Suspicious Lesion (Previously Treated as Wart) - Assessment: Lesion previously treated as a wart did not respond to freezing like other spots. Appears abnormal and not characteristic of a classic wart, raising concern for possible skin cancer. - Plan:    Perform shave biopsy of suspicious lesion    Provide post-biopsy care instructions: clean with soap and water, apply Aquaphor, cover with Band-Aid    Send biopsy for pathological analysis    Communicate results via MyChart    If confirmed as wart, plan for cryotherapy at next visit DISCOID LUPUS ERYTHEMATOSUS left parietal scalp, vertex scalp, occiptal scalp Continue Clobetasol solution once daily to affected areas on scalp. Patient will call when she needs refills.   Keep scheduled appointment with ophthalmologist 04/10/24.  Recommend starting Hydroxychloroquine pending eye exam.   Recommend DHS Zinc 2-3 times per week, leave on 5-8 minutes, rinse out.   Intralesional injection - left parietal scalp, vertex scalp, occiptal scalp Location: scalp  Informed Consent: Discussed risks (infection, pain, bleeding, bruising, thinning of the skin, loss of skin pigment, lack of resolution, and recurrence of lesion) and benefits of the procedure, as well as the alternatives. Informed consent was obtained.  Preparation: The area was prepared a standard fashion.  Anesthesia: None  Procedure Details: An intralesional injection was performed with Kenalog 10 mg/cc. 0.5 cc in total were injected.  NDC: 1308-6578-46 Lot: [id number] Exp: 03/24/2025  Total number of injections:  >7  Plan: The patient was instructed on post-op care. Recommend OTC analgesia as needed for pain.  Related Procedures ANA,IFA RA Diag Pnl w/rflx Tit/Patn Related Medications triamcinolone acetonide (KENALOG) 10 MG/ML injection 10 mg  NEOPLASM OF SKIN Right 3rd Tip of Toe Skin / nail biopsy Type of biopsy: tangential   Informed consent: discussed and consent obtained   Timeout: patient name, date of birth, surgical site, and procedure verified   Procedure prep:  Patient was prepped and draped in usual sterile fashion Prep type:  Isopropyl alcohol Anesthesia: the lesion was anesthetized in a standard fashion   Anesthetic:  1% lidocaine w/ epinephrine 1-100,000 buffered w/ 8.4% NaHCO3 Instrument used: DermaBlade   Hemostasis achieved with: pressure and aluminum chloride   Outcome: patient tolerated procedure well   Post-procedure details: sterile dressing applied and wound care instructions given   Dressing type: bandage and petrolatum   Specimen 1 - Surgical pathology Differential Diagnosis: R/O Wart vs non melanoma skin cancer  Check Margins: No   Return in about 4 months (around 07/31/2024) for DLE follow up, ILK injections.  I, Lawson Radar, CMA, am acting as scribe for [employer], DO.   Documentation: I have reviewed the above documentation for accuracy and completeness, and I agree with the above.  Langston Reusing, DO

## 2024-03-31 NOTE — Patient Instructions (Addendum)
 Hello Kim Morris,  Thank you for visiting today. Here is a summary of the key instructions:  - Medications: Apply clobetasol once a day, every day, to affected areas on the scalp. Start taking Plaquenil as recommended (dosage not specified).  - Skin Care:   - Use mineral sunscreens with zinc oxide or titanium dioxide (e.g., Neutrogena brand)   - Use DHS Zinc shampoo:     - Let it sit for 2-3 minutes before rinsing     - Follow with regular shampoo and conditioner  - Pain Management:   - Use Tylenol instead of NSAIDs (like Advil or Aleve) for pain relief   - Try Epsom salt baths for fibromyalgia aches  - Biopsy Care:   - Keep the biopsy area clean with soap and water   - Apply Aquaphor to the biopsy site   - Cover the area with a Band-Aid  - Follow-up:   - Complete eye doctor appointment on April 17th   - Return for scalp injections and monitoring every four months  - Lab Tests: Complete ANA (anti-nuclear antibody) test today  Please reach out if you have any questions or concerns.  Warm regards,  Dr. Langston Reusing, Dermatology          Patient Handout: Wound Care for Skin Biopsy Site  Taking Care of Your Skin Biopsy Site  Proper care of the biopsy site is essential for promoting healing and minimizing scarring. This handout provides instructions on how to care for your biopsy site to ensure optimal recovery.  1. Cleaning the Wound:  Clean the biopsy site daily with gentle soap and water. Gently pat the area dry with a clean, soft towel. Avoid harsh scrubbing or rubbing the area, as this can irritate the skin and delay healing.  2. Applying Aquaphor and Bandage:  After cleaning the wound, apply a thin layer of Aquaphor ointment to the biopsy site. Cover the area with a sterile bandage to protect it from dirt, bacteria, and friction. Change the bandage daily or as needed if it becomes soiled or wet.  3. Continued Care for One Week:  Repeat the cleaning,  Aquaphor application, and bandaging process daily for one week following the biopsy procedure. Keeping the wound clean and moist during this initial healing period will help prevent infection and promote optimal healing.  4. Massaging Aquaphor into the Area:  ---After one week, discontinue the use of bandages but continue to apply Aquaphor to the biopsy site. ----Gently massage the Aquaphor into the area using circular motions. ---Massaging the skin helps to promote circulation and prevent the formation of scar tissue.   Additional Tips:  Avoid exposing the biopsy site to direct sunlight during the healing process, as this can cause hyperpigmentation or worsen scarring. If you experience any signs of infection, such as increased redness, swelling, warmth, or drainage from the wound, contact your healthcare provider immediately. Follow any additional instructions provided by your healthcare provider for caring for the biopsy site and managing any discomfort. Conclusion:  Taking proper care of your skin biopsy site is crucial for ensuring optimal healing and minimizing scarring. By following these instructions for cleaning, applying Aquaphor, and massaging the area, you can promote a smooth and successful recovery. If you have any questions or concerns about caring for your biopsy site, don't hesitate to contact your healthcare provider for guidance.    Important Information  Due to recent changes in healthcare laws, you may see results of your pathology and/or laboratory studies on  MyChart before the doctors have had a chance to review them. We understand that in some cases there may be results that are confusing or concerning to you. Please understand that not all results are received at the same time and often the doctors may need to interpret multiple results in order to provide you with the best plan of care or course of treatment. Therefore, we ask that you please give [place] 2 business days to  thoroughly review all your results before contacting the office for clarification. Should we see a critical lab result, you will be contacted sooner.   If You Need Anything After Your Visit  If you have any questions or concerns for your doctor, please call our main line at [phone] If no one answers, please leave a voicemail as directed and we will return your call as soon as possible. Messages left after 4 pm will be answered the following business day.   You may also send [place] a message via MyChart. We typically respond to MyChart messages within 1-2 business days.  For prescription refills, please ask your pharmacy to contact our office. Our fax number is [phone].  If you have an urgent issue when the clinic is closed that cannot wait until the next business day, you can page your doctor at the number below.    Please note that while we do our best to be available for urgent issues outside of office hours, we are not available 24/7.   If you have an urgent issue and are unable to reach [place], you may choose to seek medical care at your doctor's office, retail clinic, urgent care center, or emergency room.  If you have a medical emergency, please immediately call 911 or go to the emergency department. In the event of inclement weather, please call our main line at [phone] for an update on the status of any delays or closures.  Dermatology Medication Tips: Please keep the boxes that topical medications come in in order to help keep track of the instructions about where and how to use these. Pharmacies typically print the medication instructions only on the boxes and not directly on the medication tubes.   If your medication is too expensive, please contact our office at [phone] or send [place] a message through MyChart.   We are unable to tell what your co-pay for medications will be in advance as this is different depending on your insurance coverage. However, we may be able to  find a substitute medication at lower cost or fill out paperwork to get insurance to cover a needed medication.   If a prior authorization is required to get your medication covered by your insurance company, please allow [place] 1-2 business days to complete this process.  Drug prices often vary depending on where the prescription is filled and some pharmacies may offer cheaper prices.  The website www.goodrx.com contains coupons for medications through different pharmacies. The prices here do not account for what the cost may be with help from insurance (it may be cheaper with your insurance), but the website can give you the price if you did not use any insurance.  - You can print the associated coupon and take it with your prescription to the pharmacy.  - You may also stop by our office during regular business hours and pick up a GoodRx coupon card.  - If you need your prescription sent electronically to a different pharmacy, notify our office through [facility] or by phone  at [phone]

## 2024-04-03 LAB — SURGICAL PATHOLOGY

## 2024-04-04 LAB — ANA,IFA RA DIAG PNL W/RFLX TIT/PATN
ANA Titer 1: NEGATIVE
Cyclic Citrullin Peptide Ab: 7 U (ref 0–19)
Rheumatoid fact SerPl-aCnc: <10 [IU]/mL (ref <14.0)

## 2024-04-08 ENCOUNTER — Encounter: Payer: Self-pay | Admitting: Dermatology

## 2024-04-10 NOTE — Telephone Encounter (Signed)
Results message was sent

## 2024-05-29 ENCOUNTER — Other Ambulatory Visit: Payer: Self-pay

## 2024-05-29 MED ORDER — HYDROXYCHLOROQUINE SULFATE 200 MG PO TABS: 200.0000 mg | ORAL_TABLET | Freq: Two times a day (BID) | ORAL | 3 refills | Status: AC

## 2024-05-29 NOTE — Progress Notes (Signed)
 Pt called requesting rx of plaquenil as she'd been on it in past. Per dr Myrtie Atkinson, sent rx and informed pt

## 2024-08-11 ENCOUNTER — Encounter: Payer: Self-pay | Admitting: Dermatology

## 2024-08-11 ENCOUNTER — Ambulatory Visit: Admitting: Dermatology

## 2024-08-11 VITALS — BP 129/71

## 2024-08-11 DIAGNOSIS — L930 Discoid lupus erythematosus: Secondary | ICD-10-CM | POA: Diagnosis not present

## 2024-08-11 MED ORDER — SAFETY SEAL MISCELLANEOUS MISC: 4 refills | Status: AC

## 2024-08-11 MED ORDER — TRIAMCINOLONE ACETONIDE 10 MG/ML IJ SUSP
5.0000 mg | Freq: Once | INTRAMUSCULAR | Status: AC
  Administered 2024-08-11: 5 mg

## 2024-08-11 NOTE — Progress Notes (Signed)
 Follow-Up Visit   Subjective  Kim Morris is a 63 y.o. female established patient who presents for FOLLOW UP on the diagnoses listed below:  Patient was last evaluated on 03/31/24.    DISCOID LUPUS ERYTHEMATOSUS: Prescribed topical clobetasol  to apply daily and Plaquenil  tabs. Patient also received Kenalog  injections in affected areas of the scalp. Patient reports sxs are better - but she noticed increased hair shedding after starting the Plaquenil  in mid-July. She D/C oral last week in preporation of OV today to discuss in regard.   Are you nursing, pregnant or trying to conceive? No   The following portions of the chart were reviewed this encounter and updated as appropriate: medications, allergies, medical history  Review of Systems:  No other skin or systemic complaints except as noted in HPI or Assessment and Plan.  Objective  Well appearing patient in no apparent distress; mood and affect are within normal limits.   A focused examination was performed of the following areas: scalp   Relevant exam findings are noted in the Assessment and Plan.         left parietal scalp, vertex scalp, occiptal scalp (20) Scattered pink inflamed lesions at vertex scalp, areas of hair loss at left parietal scalp and occipital scalp.  Assessment & Plan   1. Cutaneous Lupus with Scalp Involvement - Assessment: Patient diagnosed with cutaneous lupus in April with scalp injections initiated. Recent hair shedding occurred after discontinuing Plaquenil  due to concerns about medication-induced hair loss. Hair loss timing inconsistent with medication-induced telogen effluvium and more likely due to lupus flare or androgenetic alopecia. Some regrowth noted in previously treated areas with new spots appearing. Patient reports soreness in some areas. - Plan:    Restart hydroxychloroquine  for long-term management of cutaneous lupus    Administer intralesional steroid injections to sore areas of  scalp    Prescribe compound medication from [facility] pharmacy containing clobetasol , minoxidil, and finasteride for once daily morning application    Continue plain clobetasol  solution mixed with Vaseline as needed for itch control    Follow up in 6 months to assess Plaquenil  efficacy  2. 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 Current topical clobetasol  treatment ineffective due to difficulty with application. - Plan:    Apply clobetasol  mixed with small amount of Vaseline to affected toe area for better adherence    Continue systemic hydroxychloroquine  treatment to help manage condition    Obtain clinical photograph of toe for monitoring at next visit  Follow-up in 6 months to assess response to hydroxychloroquine  and compound medication treatment. DISCOID LUPUS ERYTHEMATOSUS left parietal scalp, vertex scalp, occiptal scalp (20)   Intralesional injection - left parietal scalp, vertex scalp, occiptal scalp (20) Procedure Note Intralesional Injection  Location: scalp  Informed Consent: Discussed risks (infection, pain, bleeding, bruising, thinning of the skin, loss of skin pigment, lack of resolution, and recurrence of lesion) and benefits of the procedure, as well as the alternatives. Informed consent was obtained. Preparation: The area was prepared a standard fashion.  Anesthesia:n/a  Procedure Details: An intralesional injection was performed with Kenalog  5 mg/cc. 0.6 cc in total were injected.  NDC #: 9996-9505-79 Exp: 02/2025  Total number of injections: 20  Plan: The patient was instructed on post-op care. Recommend OTC analgesia as needed for pain.   Related Medications triamcinolone  acetonide (KENALOG ) 10 MG/ML injection 10 mg  triamcinolone  acetonide (KENALOG ) 10 MG/ML injection 5 mg  No follow-ups on file.   Documentation: I have reviewed the above documentation for  accuracy and completeness, and I agree with the above.  I, Shirron Maranda, CMA, am acting as scribe for [employer], DO.   Delon Lenis, DO

## 2024-08-11 NOTE — Patient Instructions (Addendum)
 Date: Mon Aug 11 2024  Hello Kim Morris,  Thank you for visiting today. Here is a summary of the key instructions:  - Medications:   - Restart taking Plaquenil  as prescribed   - Use new compound medication (clobetasol , minoxidil, finasteride) once daily in the morning   - Apply a thin layer to affected areas on scalp   - For toe treatment, mix a drop of Vaseline with clobetasol  and rub it in  - Treatment Areas:   - Received injections for scalp lesions during visit  - Follow-up:   - Next appointment in 6 months  - Other Instructions:   - Continue efforts to quit smoking  Please reach out if you have any questions or concerns.  Warm regards,  Dr. Delon Lenis Dermatology Important Information  Due to recent changes in healthcare laws, you may see results of your pathology and/or laboratory studies on MyChart before the doctors have had a chance to review them. We understand that in some cases there may be results that are confusing or concerning to you. Please understand that not all results are received at the same time and often the doctors may need to interpret multiple results in order to provide you with the best plan of care or course of treatment. Therefore, we ask that you please give us  2 business days to thoroughly review all your results before contacting the office for clarification. Should we see a critical lab result, you will be contacted sooner.   If You Need Anything After Your Visit  If you have any questions or concerns for your doctor, please call our main line at [phone] If no one answers, please leave a voicemail as directed and we will return your call as soon as possible. Messages left after 4 pm will be answered the following business day.   You may also send us  a message via MyChart. We typically respond to MyChart messages within 1-2 business days.  For prescription refills, please ask your pharmacy to contact our office. Our fax number is  [phone].  If you have an urgent issue when the clinic is closed that cannot wait until the next business day, you can page your doctor at the number below.    Please note that while we do our best to be available for urgent issues outside of office hours, we are not available 24/7.   If you have an urgent issue and are unable to reach us , you may choose to seek medical care at your doctor's office, retail clinic, urgent care center, or emergency room.  If you have a medical emergency, please immediately call 911 or go to the emergency department. In the event of inclement weather, please call our main line at [phone] for an update on the status of any delays or closures.  Dermatology Medication Tips: Please keep the boxes that topical medications come in in order to help keep track of the instructions about where and how to use these. Pharmacies typically print the medication instructions only on the boxes and not directly on the medication tubes.   If your medication is too expensive, please contact our office at [phone] or send us  a message through MyChart.   We are unable to tell what your co-pay for medications will be in advance as this is different depending on your insurance coverage. However, we may be able to find a substitute medication at lower cost or fill out paperwork to get insurance to cover a needed medication.  If a prior authorization is required to get your medication covered by your insurance company, please allow us  1-2 business days to complete this process.  Drug prices often vary depending on where the prescription is filled and some pharmacies may offer cheaper prices.  The website www.goodrx.com contains coupons for medications through different pharmacies. The prices here do not account for what the cost may be with help from insurance (it may be cheaper with your insurance), but the website can give you the price if you did not use any insurance.  -  You can print the associated coupon and take it with your prescription to the pharmacy.  - You may also stop by our office during regular business hours and pick up a GoodRx coupon card.  - If you need your prescription sent electronically to a different pharmacy, notify our office through [facility] or by phone at [phone]

## 2024-12-11 ENCOUNTER — Other Ambulatory Visit: Payer: Self-pay | Admitting: Dermatology

## 2025-01-04 ENCOUNTER — Ambulatory Visit
Admission: EM | Admit: 2025-01-04 | Discharge: 2025-01-04 | Disposition: A | Discharge status: Home / Self Care | Attending: Physician Assistant | Admitting: Physician Assistant

## 2025-01-04 ENCOUNTER — Other Ambulatory Visit: Payer: Self-pay

## 2025-01-04 DIAGNOSIS — H6121 Impacted cerumen, right ear: Secondary | ICD-10-CM

## 2025-01-04 DIAGNOSIS — J209 Acute bronchitis, unspecified: Secondary | ICD-10-CM | POA: Diagnosis not present

## 2025-01-04 MED ORDER — PREDNISONE 20 MG PO TABS: 40.0000 mg | ORAL_TABLET | Freq: Every day | ORAL | 0 refills | Status: AC

## 2025-01-04 MED ORDER — IPRATROPIUM-ALBUTEROL 0.5-2.5 (3) MG/3ML IN SOLN
3.0000 mL | Freq: Once | RESPIRATORY_TRACT | Status: AC
  Administered 2025-01-04: 3 mL via RESPIRATORY_TRACT

## 2025-01-04 NOTE — ED Triage Notes (Signed)
 Pt presents with c/o nasal congestion, non-productive cough, bilateral ear pain/pressure, and little SOB. This is day five of symptoms. Was prescribed Amoxicillin  on 1/7 televisit. Took four days worth of medication. Has not noticed a difference with this medication. Is also taking OTC Mucinex. Seems to be helping her sxs. Pain voiced in left shoulder. States this is chronic. Has fibromyalgia.

## 2025-01-04 NOTE — ED Provider Notes (Signed)
 " [name]    CSN: [id number] Arrival date & time: 01/04/25  1145      History   Chief Complaint Chief Complaint  Patient presents with   Nasal Congestion   Cough    HPI Kim Morris is a 64 y.o. female.   HPI Pt is here today with concerns for cough, ear fullness, nasal congestion, sore throat that started on 12/30/24. She was seen virtually and prescribed amoxicillin  on 12/31/24 and has not noticed an improvement. She states she has had fever of 100.5 at night. She states she has had some SOB but is unsure if this is from wearing her mask.  She also reports decreased appetite, nausea, and some body aches. She is unsure if her body aches are due to illness or fibromyalgia.  Interventions: acetaminophen  and Mucinex in addition to Abx   Past Medical History:  Diagnosis Date   Anxiety    COPD (chronic obstructive pulmonary disease) (HCC) 02/2017   Fibromyalgia    GERD (gastroesophageal reflux disease)    Hyperlipidemia 11/06/2018   IBS (irritable bowel syndrome)    Neuroma of right leg    PAT (paroxysmal atrial tachycardia)    uses Rx PRN   Precordial chest pain 11/06/2018   Shingles 2022    Patient Active Problem List   Diagnosis Date Noted   Menopausal symptom 01/02/2023   Atherosclerosis of coronary artery 11/24/2021   Elevated blood-pressure reading without diagnosis of hypertension 11/24/2021   Precordial chest pain 11/06/2018   Hyperlipidemia 11/06/2018   History of postmenopausal HRT 11/26/2017   Tobacco user 11/26/2017   COPD (chronic obstructive pulmonary disease) (HCC) 06/12/2017   Temporomandibular joint (TMJ) pain 04/17/2017   Fibromyalgia 10/09/2014    Past Surgical History:  Procedure Laterality Date   CESAREAN SECTION     GALLBLADDER SURGERY     LAPAROSCOPIC VAGINAL HYSTERECTOMY  12/12/2010   ovaries remain   TONSILLECTOMY AND ADENOIDECTOMY     TUBAL LIGATION     VAGINAL PROLAPSE REPAIR      OB History     Gravida  4    Para  3   Term  3   Preterm  0   AB  1   Living  3      SAB  1   IAB  0   Ectopic  0   Multiple  0   Live Births  3            Home Medications    Prior to Admission medications  Medication Sig Start Date End Date Taking? Authorizing Provider  predniSONE  (DELTASONE ) 20 MG tablet Take 2 tablets (40 mg total) by mouth daily for 5 days. 01/04/25 01/09/25 Yes Shariah Assad E, PA-C  albuterol  (PROVENTIL  HFA;VENTOLIN  HFA) 108 (90 Base) MCG/ACT inhaler TAKE 2 PUFFS BY MOUTH EVERY 6 HOURS AS NEEDED FOR WHEEZE OR SHORTNESS OF BREATH 03/31/19   Jude Harden GAILS, MD  b complex vitamins tablet Take 1 tablet by mouth daily.    [provider]  BIOTIN PO Take by mouth.    [provider]  BREO ELLIPTA 100-25 MCG/INH AEPB daily. 03/08/17   [provider]  cetirizine (ZYRTEC) 10 MG tablet Take 10 mg by mouth daily.    [provider]  cholecalciferol (VITAMIN D) 1000 units tablet Take 2,000 Units by mouth every other day.     [provider]  clobetasol  (TEMOVATE ) 0.05 % external solution APPLY TO AFFECTED AREA TWICE A DAY 12/11/24  Alm Delon SAILOR, DO  cyclobenzaprine (FLEXERIL) 10 MG tablet Take 5-10 mg by mouth daily as needed. 06/08/20   [provider]  DULoxetine (CYMBALTA) 20 MG capsule Take 20 mg by mouth daily.    [provider]  estradiol  (VIVELLE -DOT) 0.025 MG/24HR Place 1 patch onto the skin 2 (two) times a week. 01/04/23   Leftwich-Kirby, Olam LABOR, CNM  fluticasone (FLONASE) 50 MCG/ACT nasal spray 2 (two) times daily. 03/06/17   [provider]  hydroxychloroquine  (PLAQUENIL ) 200 MG tablet Take 1 tablet (200 mg total) by mouth 2 (two) times daily. 05/29/24   Alm Delon SAILOR, DO  LORazepam (ATIVAN) 1 MG tablet Take 1 mg by mouth at bedtime.     [provider]  Multiple Vitamins-Minerals (PRESERVISION AREDS 2 PO) Take by mouth daily.    [provider]  omeprazole (PRILOSEC) 40 MG capsule Take  40 mg by mouth daily.    [provider]  polyethylene glycol powder (GLYCOLAX/MIRALAX) powder MIX 1 CAPFUL IN 8OZ OF WATER DAILY AS NEEDED FOR CONSTIPATION 11/24/15   [provider]  psyllium (METAMUCIL) 58.6 % packet Take 1 packet by mouth daily.    [provider]  rosuvastatin (CRESTOR) 10 MG tablet Take 10 mg by mouth daily.    [provider]  Safety Seal Miscellaneous MISC Minoxidil 8% clobetasol  0.05% finasteride 1% solution - apply to affected areas of the scalp QAM 08/11/24   Alm Delon SAILOR, DO  Simethicone (GAS-X PO) Take by mouth.    [provider]  triamcinolone  ointment (KENALOG ) 0.1 % Apply topically 2 (two) times daily. 06/24/20   [provider]  vitamin C (ASCORBIC ACID) 500 MG tablet Take 500 mg by mouth daily.    [provider]    Family History Family History  Problem Relation Age of Onset   Hypertension Mother    Other Mother        PAT-mother, sister, maternal grandmother   COPD Mother    Diabetes Father    Kidney cancer Father    Asthma Sister    Melanoma Sister        Metastatic to liver   Cancer Maternal Grandmother        liver   Thyroid disease Maternal Grandmother    Hypertension Maternal Grandmother    Diabetes Maternal Grandmother    Liver cancer Maternal Grandfather    Cancer Paternal Uncle        pancreatic    Social History Social History[1]   Allergies   Elemental sulfur, Magnesium-containing compounds, Morphine and codeine, and Silvadene [silver sulfadiazine]   Review of Systems Review of Systems  Constitutional:  Positive for appetite change, chills, fatigue and fever.  HENT:  Positive for congestion, ear pain (ear fullness), sinus pressure, sinus pain and sore throat.   Respiratory:  Positive for cough and shortness of breath.   Gastrointestinal:  Positive for nausea. Negative for diarrhea and vomiting.  Musculoskeletal:  Positive for myalgias.     Physical  Exam Triage Vital Signs ED Triage Vitals  Encounter Vitals Group     BP 01/04/25 1206 130/78     Girls Systolic BP Percentile --      Girls Diastolic BP Percentile --      Boys Systolic BP Percentile --      Boys Diastolic BP Percentile --      Pulse Rate 01/04/25 1206 83     Resp 01/04/25 1206 18     Temp 01/04/25 1206 98  F (36.7 C)     Temp Source 01/04/25 1206 Oral     SpO2 01/04/25 1206 94 %     Weight --      Height --      Head Circumference --      Peak Flow --      Pain Score 01/04/25 1203 4     Pain Loc --      Pain Education --      Exclude from Growth Chart --    No data found.  Updated Vital Signs BP 130/78 (BP Location: Right Arm)   Pulse 83   Temp 98 F (36.7 C) (Oral)   Resp 18   LMP 11/24/2010   SpO2 94%   Visual Acuity Right Eye Distance:   Left Eye Distance:   Bilateral Distance:    Right Eye Near:   Left Eye Near:    Bilateral Near:     Physical Exam Vitals reviewed.  Constitutional:      General: She is awake.     Appearance: Normal appearance. She is well-developed and well-groomed.  HENT:     Head: Normocephalic and atraumatic.     Right Ear: Hearing, tympanic membrane and ear canal normal.     Left Ear: Hearing, tympanic membrane and ear canal normal.     Ears:     Comments: Patient initially had cerumen impaction of the right ear but this was resolved with ear lavage.  Reexam of the right ear demonstrates clear canal without evidence of TM abnormality    Mouth/Throat:     Lips: Pink.     Mouth: Mucous membranes are moist.     Pharynx: Oropharynx is clear. Uvula midline. No pharyngeal swelling, oropharyngeal exudate, posterior oropharyngeal erythema, uvula swelling or postnasal drip.  Cardiovascular:     Rate and Rhythm: Normal rate and regular rhythm.     Pulses: Normal pulses.          Radial pulses are 2+ on the right side and 2+ on the left side.     Heart sounds: Normal heart sounds. No murmur heard.    No friction rub. No  gallop.  Pulmonary:     Effort: Pulmonary effort is normal.     Breath sounds: Decreased air movement present. Examination of the right-middle field reveals wheezing. Examination of the left-middle field reveals wheezing. Wheezing present. No decreased breath sounds, rhonchi or rales.  Musculoskeletal:     Cervical back: Normal range of motion and neck supple.  Lymphadenopathy:     Head:     Right side of head: No submental, submandibular or preauricular adenopathy.     Left side of head: No submental, submandibular or preauricular adenopathy.     Cervical:     Right cervical: No superficial cervical adenopathy.    Left cervical: No superficial cervical adenopathy.     Upper Body:     Right upper body: No supraclavicular adenopathy.     Left upper body: No supraclavicular adenopathy.  Neurological:     General: No focal deficit present.     Mental Status: She is alert and oriented to person, place, and time.  Psychiatric:        Mood and Affect: Mood normal.        Behavior: Behavior normal. Behavior is cooperative.        Thought Content: Thought content normal.        Judgment: Judgment normal.      UC Treatments / Results  Labs (all labs ordered are listed, but only abnormal results are displayed) Labs Reviewed - No data to display  EKG   Radiology No results found.  Procedures Procedures (including critical care time)  Medications Ordered in UC Medications  ipratropium-albuterol  (DUONEB) 0.5-2.5 (3) MG/3ML nebulizer solution 3 mL (3 mLs Nebulization Given 01/04/25 1241)    Initial Impression / Assessment and Plan / UC Course  I have reviewed the triage vital signs and the nursing notes.  Pertinent labs & imaging results that were available during my care of the patient were reviewed by me and considered in my medical decision making (see chart for details).      Final Clinical Impressions(s) / UC Diagnoses   Final diagnoses:  Acute bronchitis, unspecified  organism  Impacted cerumen of right ear   Patient is here today with concerns of ear fullness, nasal congestion, coughing, shortness of breath and nausea that has been ongoing since 12/30/2024.  She was seen via telehealth on 12/31/2024 and started on amoxicillin  which she states has not provided much relief.  She is also been taking OTC medications without relief.  Physical exam reveals global wheezing and mildly decreased air movement.  DuoNeb breathing treatment was administered in the clinic which resolved this with the exception of mild, faint wheezes in the mid lung fields.  At this time I suspect likely acute bronchitis and will treat with prednisone  40 mg p.o. daily x 5-day burst.  Recommend that patient discontinues amoxicillin  at this time as I am skeptical she had a true bacterial infection based on her symptoms and chronicity.  Reviewed OTC medications to provide further symptomatic relief.  Cerumen impaction was resolved completely with ear lavage in clinic.  ED return precautions reviewed and provided in AVS.  Follow-up as needed.   Discharge Instructions      You are seen today for concerns of a persistent cough, shortness of breath, ear pain and nasal congestion.  He reports that your symptoms are not improving with an antibiotic which I think is due to your symptoms being caused by a viral illness.  I would recommend stopping your amoxicillin  at this time and recommend the following to help with your management: If sending in a prednisone  burst for you to take by mouth once per day in the morning with breakfast.  Please start this in the morning tomorrow Regular formulations of Robitussin, Mucinex Tylenol  as needed Flonase nasal spray Humidifier at night  If you feel like your symptoms are not improving or seem to be worsening please return here or follow-up with your PCP.  If you develop any of the following symptoms please go to the ER: Fever that is not responding to Tylenol  and  ibuprofen, difficulty breathing, confusion, neck pain or stiffness, loss of consciousness     ED Prescriptions     Medication Sig Dispense Auth. Provider   predniSONE  (DELTASONE ) 20 MG tablet Take 2 tablets (40 mg total) by mouth daily for 5 days. 10 tablet Jayvien Rowlette E, PA-C      PDMP not reviewed this encounter.     [1]  Social History Tobacco Use   Smoking status: Every Day    Current packs/day: 1.00    Average packs/day: 1 pack/day for 43.6 years (43.6 ttl pk-yrs)    Types: Cigarettes    Start date: 06/12/1981   Smokeless tobacco: Never   Tobacco comments:    3/4-1 PPD  Vaping Use   Vaping status: Never Used  Substance Use Topics  Alcohol use: Yes    Comment: 2-3 a month   Drug use: No     Glorine Hanratty, Rocky BRAVO, PA-C 01/04/25 1522  "

## 2025-01-04 NOTE — Discharge Instructions (Addendum)
 You are seen today for concerns of a persistent cough, shortness of breath, ear pain and nasal congestion.  He reports that your symptoms are not improving with an antibiotic which I think is due to your symptoms being caused by a viral illness.  I would recommend stopping your amoxicillin  at this time and recommend the following to help with your management: If sending in a prednisone  burst for you to take by mouth once per day in the morning with breakfast.  Please start this in the morning tomorrow Regular formulations of Robitussin, Mucinex Tylenol  as needed Flonase nasal spray Humidifier at night  If you feel like your symptoms are not improving or seem to be worsening please return here or follow-up with your PCP.  If you develop any of the following symptoms please go to the ER: Fever that is not responding to Tylenol  and ibuprofen, difficulty breathing, confusion, neck pain or stiffness, loss of consciousness

## 2025-01-12 ENCOUNTER — Ambulatory Visit: Admitting: Radiology

## 2025-01-12 ENCOUNTER — Other Ambulatory Visit: Payer: Self-pay

## 2025-01-12 ENCOUNTER — Ambulatory Visit
Admission: RE | Admit: 2025-01-12 | Discharge: 2025-01-12 | Disposition: A | Discharge status: Home / Self Care | Attending: Physician Assistant | Admitting: Physician Assistant

## 2025-01-12 VITALS — BP 118/74 | HR 81 | Temp 98.1°F | Resp 18

## 2025-01-12 DIAGNOSIS — R058 Other specified cough: Secondary | ICD-10-CM

## 2025-01-12 DIAGNOSIS — J019 Acute sinusitis, unspecified: Secondary | ICD-10-CM | POA: Diagnosis not present

## 2025-01-12 MED ORDER — PROMETHAZINE-DM 6.25-15 MG/5ML PO SYRP: 5.0000 mL | ORAL_SOLUTION | Freq: Four times a day (QID) | ORAL | 0 refills | Status: AC | PRN

## 2025-01-12 MED ORDER — DOXYCYCLINE HYCLATE 100 MG PO CAPS: 100.0000 mg | ORAL_CAPSULE | Freq: Two times a day (BID) | ORAL | 0 refills | Status: AC

## 2025-01-12 NOTE — ED Provider Notes (Signed)
 " [name]    CSN: [id number] Arrival date & time: 01/12/25  1155      History   Chief Complaint Chief Complaint  Patient presents with   Cough    Was seen at this location 01/03/25 diagnosed with bronchitis. Finished prednisone  on 01/10/25. I cannot shake this cough. My sinuses are bloody and my throat feels raw. Cannot sleep at night for coughing. - Entered by patient   Follow-up    HPI Kim Morris is a 64 y.o. female.   HPI   Pt is here today with concerns for persistent, intermittently productive cough, runny nose with bloody mucus, sore throat, body aches, ear pain. She states she was seen at this UC last week on 01/03/25 and dx with bronchitis. She finished her Prednisone  from that encounter on 01/10/25 and reports minimal improvement in her symptoms.   Interventions: Mucinex and prescribed medications     Past Medical History:  Diagnosis Date   Anxiety    COPD (chronic obstructive pulmonary disease) (HCC) 02/2017   Fibromyalgia    GERD (gastroesophageal reflux disease)    Hyperlipidemia 11/06/2018   IBS (irritable bowel syndrome)    Neuroma of right leg    PAT (paroxysmal atrial tachycardia)    uses Rx PRN   Precordial chest pain 11/06/2018   Shingles 2022    Patient Active Problem List   Diagnosis Date Noted   Menopausal symptom 01/02/2023   Atherosclerosis of coronary artery 11/24/2021   Elevated blood-pressure reading without diagnosis of hypertension 11/24/2021   Precordial chest pain 11/06/2018   Hyperlipidemia 11/06/2018   History of postmenopausal HRT 11/26/2017   Tobacco user 11/26/2017   COPD (chronic obstructive pulmonary disease) (HCC) 06/12/2017   Temporomandibular joint (TMJ) pain 04/17/2017   Fibromyalgia 10/09/2014    Past Surgical History:  Procedure Laterality Date   CESAREAN SECTION     GALLBLADDER SURGERY     LAPAROSCOPIC VAGINAL HYSTERECTOMY  12/12/2010   ovaries remain   TONSILLECTOMY AND ADENOIDECTOMY      TUBAL LIGATION     VAGINAL PROLAPSE REPAIR      OB History     Gravida  4   Para  3   Term  3   Preterm  0   AB  1   Living  3      SAB  1   IAB  0   Ectopic  0   Multiple  0   Live Births  3            Home Medications    Prior to Admission medications  Medication Sig Start Date End Date Taking? Authorizing Provider  doxycycline  (VIBRAMYCIN ) 100 MG capsule Take 1 capsule (100 mg total) by mouth 2 (two) times daily for 7 days. 01/12/25 01/19/25 Yes Maddisen Vought E, PA-C  promethazine -dextromethorphan (PROMETHAZINE -DM) 6.25-15 MG/5ML syrup Take 5 mLs by mouth 4 (four) times daily as needed. 01/12/25  Yes Quadre Bristol E, PA-C  albuterol  (PROVENTIL  HFA;VENTOLIN  HFA) 108 (90 Base) MCG/ACT inhaler TAKE 2 PUFFS BY MOUTH EVERY 6 HOURS AS NEEDED FOR WHEEZE OR SHORTNESS OF BREATH 03/31/19   Jude Harden GAILS, MD  b complex vitamins tablet Take 1 tablet by mouth daily.    [provider]  BIOTIN PO Take by mouth.    [provider]  BREO ELLIPTA 100-25 MCG/INH AEPB daily. 03/08/17   [provider]  cetirizine (ZYRTEC) 10 MG tablet Take 10 mg by mouth daily.    [provider]  cholecalciferol (VITAMIN D) 1000 units tablet Take 2,000 Units by mouth every other day.     [provider]  clobetasol  (TEMOVATE ) 0.05 % external solution APPLY TO AFFECTED AREA TWICE A DAY 12/11/24   Alm Delon SAILOR, DO  cyclobenzaprine (FLEXERIL) 10 MG tablet Take 5-10 mg by mouth daily as needed. 06/08/20   [provider]  DULoxetine (CYMBALTA) 20 MG capsule Take 20 mg by mouth daily.    [provider]  estradiol  (VIVELLE -DOT) 0.025 MG/24HR Place 1 patch onto the skin 2 (two) times a week. 01/04/23   Leftwich-Kirby, Olam LABOR, CNM  fluticasone (FLONASE) 50 MCG/ACT nasal spray 2 (two) times daily. 03/06/17   [provider]  hydroxychloroquine  (PLAQUENIL ) 200 MG tablet Take 1 tablet (200 mg total) by mouth 2 (two) times daily. 05/29/24    Alm Delon SAILOR, DO  LORazepam (ATIVAN) 1 MG tablet Take 1 mg by mouth at bedtime.     [provider]  Multiple Vitamins-Minerals (PRESERVISION AREDS 2 PO) Take by mouth daily.    [provider]  omeprazole (PRILOSEC) 40 MG capsule Take 40 mg by mouth daily.    [provider]  polyethylene glycol powder (GLYCOLAX/MIRALAX) powder MIX 1 CAPFUL IN 8OZ OF WATER DAILY AS NEEDED FOR CONSTIPATION 11/24/15   [provider]  psyllium (METAMUCIL) 58.6 % packet Take 1 packet by mouth daily.    [provider]  rosuvastatin (CRESTOR) 10 MG tablet Take 10 mg by mouth daily.    [provider]  Safety Seal Miscellaneous MISC Minoxidil 8% clobetasol  0.05% finasteride 1% solution - apply to affected areas of the scalp QAM 08/11/24   Alm Delon SAILOR, DO  Simethicone (GAS-X PO) Take by mouth.    [provider]  triamcinolone  ointment (KENALOG ) 0.1 % Apply topically 2 (two) times daily. 06/24/20   [provider]  vitamin C (ASCORBIC ACID) 500 MG tablet Take 500 mg by mouth daily.    [provider]    Family History Family History  Problem Relation Age of Onset   Hypertension Mother    Other Mother        PAT-mother, sister, maternal grandmother   COPD Mother    Diabetes Father    Kidney cancer Father    Asthma Sister    Melanoma Sister        Metastatic to liver   Cancer Maternal Grandmother        liver   Thyroid disease Maternal Grandmother    Hypertension Maternal Grandmother    Diabetes Maternal Grandmother    Liver cancer Maternal Grandfather    Cancer Paternal Uncle        pancreatic    Social History Social History[1]   Allergies   Elemental sulfur, Magnesium-containing compounds, Morphine and codeine, and Silvadene [silver sulfadiazine]   Review of Systems Review of Systems  Constitutional:  Negative for chills and fever.  HENT:  Positive for congestion, ear pain, rhinorrhea and sore throat.    Respiratory:  Positive for cough and shortness of breath. Negative for wheezing.   Gastrointestinal:  Negative for diarrhea, nausea and vomiting.  Musculoskeletal:  Positive for myalgias.     Physical Exam Triage Vital Signs ED Triage Vitals  Encounter Vitals Group     BP 01/12/25 1213 118/74     Girls Systolic BP Percentile --      Girls Diastolic BP Percentile --      Boys Systolic BP Percentile --  Boys Diastolic BP Percentile --      Pulse Rate 01/12/25 1213 81     Resp 01/12/25 1213 18     Temp 01/12/25 1213 98.1 F (36.7 C)     Temp Source 01/12/25 1213 Oral     SpO2 01/12/25 1213 94 %     Weight --      Height --      Head Circumference --      Peak Flow --      Pain Score 01/12/25 1211 5     Pain Loc --      Pain Education --      Exclude from Growth Chart --    No data found.  Updated Vital Signs BP 118/74 (BP Location: Right Arm)   Pulse 81   Temp 98.1 F (36.7 C) (Oral)   Resp 18   LMP 11/24/2010   SpO2 94%   Visual Acuity Right Eye Distance:   Left Eye Distance:   Bilateral Distance:    Right Eye Near:   Left Eye Near:    Bilateral Near:     Physical Exam Vitals reviewed.  Constitutional:      General: She is awake.     Appearance: Normal appearance. She is well-developed and well-groomed.  HENT:     Head: Normocephalic and atraumatic.     Right Ear: Hearing, tympanic membrane and ear canal normal.     Left Ear: Hearing, tympanic membrane and ear canal normal.     Mouth/Throat:     Lips: Pink.     Mouth: Mucous membranes are moist.     Pharynx: Oropharynx is clear. Uvula midline. No pharyngeal swelling, oropharyngeal exudate, posterior oropharyngeal erythema, uvula swelling or postnasal drip.  Cardiovascular:     Rate and Rhythm: Normal rate and regular rhythm.     Pulses: Normal pulses.          Radial pulses are 2+ on the right side and 2+ on the left side.     Heart sounds: Normal heart sounds. No murmur heard.    No friction  rub. No gallop.  Pulmonary:     Effort: Pulmonary effort is normal.     Breath sounds: No decreased air movement. Examination of the right-upper field reveals wheezing. Examination of the right-middle field reveals wheezing. Wheezing present. No decreased breath sounds, rhonchi or rales.  Musculoskeletal:     Cervical back: Normal range of motion and neck supple.  Lymphadenopathy:     Head:     Right side of head: No submental, submandibular or preauricular adenopathy.     Left side of head: No submental, submandibular or preauricular adenopathy.     Cervical:     Right cervical: No superficial cervical adenopathy.    Left cervical: No superficial cervical adenopathy.     Upper Body:     Right upper body: No supraclavicular adenopathy.     Left upper body: No supraclavicular adenopathy.  Neurological:     Mental Status: She is alert.  Psychiatric:        Behavior: Behavior is cooperative.      UC Treatments / Results  Labs (all labs ordered are listed, but only abnormal results are displayed) Labs Reviewed - No data to display  EKG   Radiology No results found.  Procedures Procedures (including critical care time)  Medications Ordered in UC Medications - No data to display  Initial Impression / Assessment and Plan / UC Course  I have reviewed  the triage vital signs and the nursing notes.  Pertinent labs & imaging results that were available during my care of the patient were reviewed by me and considered in my medical decision making (see chart for details).      Final Clinical Impressions(s) / UC Diagnoses   Final diagnoses:  Productive cough  Acute sinusitis, recurrence not specified, unspecified location   Patient is here today with concerns of nasal congestion as well as a persisting cough.  She was seen at this UC on 01/03/2025 and prescribed a prednisone  burst to assist with her symptoms for bronchitis.  She reports minimal improvement despite finishing  medications as directed.  Physical exam is notable for slight wheezes in the upper and mid right lung fields.  Vitals are largely reassuring.  Given chronicity of symptoms as well as lack of improvement I suspect potential bacterial sinus infection.  Chest x-ray is still pending from radiology and we will keep patient updated on results.  To assist with symptom management will start doxycycline  100 mg p.o. twice daily x 7 days.  This should provide coverage for bacterial sinusitis as well as pneumonia if needed.  Patient is also concerned as her coughing has not improved so we will send promethazine -dextromethorphan cough syrup to assist with management for that.  Reviewed that she can continue to take OTC medications as needed for further symptomatic relief.  ED and return cautions reviewed and provided in AVS.  Follow-up as needed    Discharge Instructions      You were seen today for concerns of a persisting cough as well as bloody nasal drainage.  We completed an x-ray and are waiting on radiology to review it or your final interpretation.  We will keep you updated on those results via MyChart and with the phone call if changes are needed to your management plan.  Based on your symptoms and duration of illness, I believe you may have a bacterial sinus infection  These typically resolve with antibiotic therapy along with at-home comfort measures  Today I have sent in a prescription for  Doxycycline  100 mg to be taken by mouth twice per day for 7 days. Please be advised that doxycycline  can cause sun light sensitivity so please use protective clothing and sunscreen while using it.  Doxycycline  can be rough on the stomach so please try to take it with food. FINISH THE ENTIRE COURSE unless you develop an allergic reaction or are instructed to discontinue.  To assist with your symptoms I am also sending in a cough syrup called promethazine -dextromethorphan.  Please be advised that this can cause  drowsiness so do not take it if you need to remain alert or drive.  It can take a few days for the antibiotic to kick in so I recommend symptomatic relief with over the counter medication such as the following: Dayquil/ Nyquil Theraflu Alkaseltzer   If you have high blood pressure I recommend that you take Mucinex, Robitussin, Tylenol  instead of the combination medications.  Combination medications typically have a decongestant in them that can cause your blood pressure to be high.  These medications typically have Tylenol  in them already so you do not need to supplement with more outside of those medications  Stay well hydrated with at least 75 oz of water per day to help with recovery  If at any point you start to develop swelling around the eyes and nose, trouble seeing, fevers that are not responding to medications, trouble breathing, passing out  or headaches that are very severe please go to the emergency room for further evaluation management      ED Prescriptions     Medication Sig Dispense Auth. Provider   promethazine -dextromethorphan (PROMETHAZINE -DM) 6.25-15 MG/5ML syrup Take 5 mLs by mouth 4 (four) times daily as needed. 118 mL Tierria Watson E, PA-C   doxycycline  (VIBRAMYCIN ) 100 MG capsule Take 1 capsule (100 mg total) by mouth 2 (two) times daily for 7 days. 14 capsule Pratham Cassatt E, PA-C      PDMP not reviewed this encounter.     [1]  Social History Tobacco Use   Smoking status: Every Day    Current packs/day: 1.00    Average packs/day: 1 pack/day for 43.6 years (43.6 ttl pk-yrs)    Types: Cigarettes    Start date: 06/12/1981   Smokeless tobacco: Never   Tobacco comments:    3/4-1 PPD  Vaping Use   Vaping status: Never Used  Substance Use Topics   Alcohol use: Yes    Comment: 2-3 a month   Drug use: No     Mykeal Carrick, Rocky BRAVO, PA-C 01/12/25 1319  "

## 2025-01-12 NOTE — Discharge Instructions (Addendum)
 You were seen today for concerns of a persisting cough as well as bloody nasal drainage.  We completed an x-ray and are waiting on radiology to review it or your final interpretation.  We will keep you updated on those results via MyChart and with the phone call if changes are needed to your management plan.  Based on your symptoms and duration of illness, I believe you may have a bacterial sinus infection  These typically resolve with antibiotic therapy along with at-home comfort measures  Today I have sent in a prescription for  Doxycycline  100 mg to be taken by mouth twice per day for 7 days. Please be advised that doxycycline  can cause sun light sensitivity so please use protective clothing and sunscreen while using it.  Doxycycline  can be rough on the stomach so please try to take it with food. FINISH THE ENTIRE COURSE unless you develop an allergic reaction or are instructed to discontinue.  To assist with your symptoms I am also sending in a cough syrup called promethazine -dextromethorphan.  Please be advised that this can cause drowsiness so do not take it if you need to remain alert or drive.  It can take a few days for the antibiotic to kick in so I recommend symptomatic relief with over the counter medication such as the following: Dayquil/ Nyquil Theraflu Alkaseltzer   If you have high blood pressure I recommend that you take Mucinex, Robitussin, Tylenol  instead of the combination medications.  Combination medications typically have a decongestant in them that can cause your blood pressure to be high.  These medications typically have Tylenol  in them already so you do not need to supplement with more outside of those medications  Stay well hydrated with at least 75 oz of water per day to help with recovery  If at any point you start to develop swelling around the eyes and nose, trouble seeing, fevers that are not responding to medications, trouble breathing, passing out or headaches  that are very severe please go to the emergency room for further evaluation management

## 2025-01-12 NOTE — ED Triage Notes (Signed)
 Pt presents to urgent care for follow-up. States she is still experiencing a productive cough, nasal congestion, headaches, raw throat, and fatigue. Unable to sleep due to the cough. Able to finish prednisone  prescription two days ago with minimal improvement. Currently rates overall pain a 5/10. Voicing most of her pain in head. Using vaporizer at nighttime. Has also been taking Mucinex sporadically.

## 2025-01-13 ENCOUNTER — Ambulatory Visit (HOSPITAL_COMMUNITY): Payer: Self-pay

## 2025-01-20 ENCOUNTER — Ambulatory Visit (INDEPENDENT_AMBULATORY_CARE_PROVIDER_SITE_OTHER): Admitting: Obstetrics & Gynecology

## 2025-01-20 ENCOUNTER — Encounter (HOSPITAL_BASED_OUTPATIENT_CLINIC_OR_DEPARTMENT_OTHER): Payer: Self-pay | Admitting: Obstetrics & Gynecology

## 2025-01-20 VITALS — BP 127/74 | HR 67 | Ht 62.0 in | Wt 133.0 lb

## 2025-01-20 DIAGNOSIS — L930 Discoid lupus erythematosus: Secondary | ICD-10-CM | POA: Insufficient documentation

## 2025-01-20 DIAGNOSIS — Z1331 Encounter for screening for depression: Secondary | ICD-10-CM | POA: Diagnosis not present

## 2025-01-20 DIAGNOSIS — Z01419 Encounter for gynecological examination (general) (routine) without abnormal findings: Secondary | ICD-10-CM | POA: Diagnosis not present

## 2025-01-20 DIAGNOSIS — Z720 Tobacco use: Secondary | ICD-10-CM

## 2025-01-20 DIAGNOSIS — N952 Postmenopausal atrophic vaginitis: Secondary | ICD-10-CM

## 2025-01-20 DIAGNOSIS — Z87891 Personal history of nicotine dependence: Secondary | ICD-10-CM

## 2025-01-20 DIAGNOSIS — N898 Other specified noninflammatory disorders of vagina: Secondary | ICD-10-CM

## 2025-01-20 DIAGNOSIS — Z90710 Acquired absence of both cervix and uterus: Secondary | ICD-10-CM | POA: Diagnosis not present

## 2025-01-20 DIAGNOSIS — Z1231 Encounter for screening mammogram for malignant neoplasm of breast: Secondary | ICD-10-CM

## 2025-01-20 MED ORDER — ESTRADIOL 0.01 % VA CREA: TOPICAL_CREAM | VAGINAL | 3 refills | Status: AC

## 2025-01-20 MED ORDER — FLUCONAZOLE 150 MG PO TABS: 150.0000 mg | ORAL_TABLET | Freq: Once | ORAL | 0 refills | Status: AC

## 2025-01-20 NOTE — Patient Instructions (Signed)
Call [phone] to schedule an appointment at [facility].

## 2025-01-20 NOTE — Progress Notes (Signed)
 "  ANNUAL EXAM Patient name: Kim Morris MRN [id number]  Date of birth: 1961-08-02 Chief Complaint:   Gynecologic Exam  History of Present Illness:   Kim Morris is a 64 y.o. [phone] Caucasian female being seen today for a routine annual exam.  Has been on antibiotics and having some vaginitis symptoms.    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  She is on hydrochloroquine with her dermatologist at [facility].    Denies vaginal bleeding.  She is having painful intercourse.  She does have some urinary leakage with laughing and coughing.    Patient's last menstrual period was 11/24/2010.   Last pap: Hysterectomy. H/O abnormal pap: yes Last mammogram: 01/10/2024. Results were: normal. Family h/o breast cancer: no Last colonoscopy: 09/2024.  Dr. Kristie.  One polyp.  Follow up 7 years. DEXA: 09/03/2023.  Osteopenia.       01/20/2025    2:45 PM 12/03/2023    3:34 PM 12/03/2023    3:33 PM 01/02/2023   12:55 PM 11/24/2021    4:36 PM  Depression screen PHQ 2/9  Decreased Interest 0 0 0 0 0  Down, Depressed, Hopeless 0 0 0 0 0  PHQ - 2 Score 0 0 0 0 0    Review of Systems:   Pertinent items are noted in HPI Denies any vaginal bleeding.  Denies bowel changes. Pertinent History Reviewed:  Reviewed past medical,surgical, social and family history.  Reviewed problem list, medications and allergies. Physical Assessment:   Vitals:   01/20/25 1444  BP: 127/74  Pulse: 67  SpO2: 100%  Weight: 133 lb (60.3 kg)  Height: 5' 2 (1.575 m)  Body mass index is 24.33 kg/m.        Physical Examination:   General appearance - well appearing, and in no distress  Mental status - alert, oriented to person, place, and time  Psych:  She has a normal mood and affect  Skin - warm and dry, normal color, no suspicious lesions noted  Chest - effort normal, all lung fields clear to auscultation bilaterally  Heart - normal rate and regular rhythm  Neck:  midline trachea, no  thyromegaly or nodules  Breasts - breasts appear normal, no suspicious masses, no skin or nipple changes or  axillary nodes  Abdomen - soft, nontender, nondistended, no masses or organomegaly  Pelvic - VULVA: normal appearing vulva with no masses, tenderness or lesions   VAGINA: normal appearing vagina with normal color and discharge, no lesions   CERVIX: surgically absent  Thin prep pap is not indicated  UTERUS: surgically absent  ADNEXA: No adnexal masses or tenderness noted.  Rectal - normal rectal, good sphincter tone, no masses felt  Extremities:  No swelling or varicosities noted  Chaperone present for exam  No results found for this or any previous visit (from the past 24 hours).  Assessment & Plan:  1. Well woman exam with routine gynecological exam (Primary) - Pap smear not indicated - Mammogram 12/31/2023 - Colonoscopy 10/24/2024 - Bone mineral density 204 - lab work done with PCP - vaccines reviewed/updated  2. Vaginal discharge - fluconazole  (DIFLUCAN ) 150 MG tablet; Take 1 tablet (150 mg total) by mouth once for 1 dose. Repeat in 72 hours.  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 Vaginal atrophy - estradiol  (ESTRACE ) 0.01 % CREA vaginal cream; 1 gram vaginally twice weekly  Dispense: 42.5 g; Refill: 3  5. Smoking history - CT CHEST LUNG CA SCREEN LOW DOSE W/O CM; Future  6. Encounter for screening mammogram for malignant neoplasm of breast - MM 3D SCREENING MAMMOGRAM BILATERAL BREAST; Future  Orders Placed This Encounter  Procedures   CT CHEST LUNG CA SCREEN LOW DOSE W/O CM   MM 3D SCREENING MAMMOGRAM BILATERAL BREAST    Meds:  Meds ordered this encounter  Medications   fluconazole  (DIFLUCAN ) 150 MG tablet    Sig: Take 1 tablet (150 mg total) by mouth once for 1 dose. Repeat in 72 hours.    Dispense:  2 tablet    Refill:  0   estradiol  (ESTRACE ) 0.01 % CREA vaginal cream    Sig: 1 gram vaginally  twice weekly    Dispense:  42.5 g    Refill:  3    Follow-up: No follow-ups on file.  Ronal GORMAN Pinal, MD 01/20/2025 3:19 PM "

## 2025-02-16 ENCOUNTER — Ambulatory Visit: Admitting: Dermatology

## 2025-04-08 ENCOUNTER — Ambulatory Visit (HOSPITAL_BASED_OUTPATIENT_CLINIC_OR_DEPARTMENT_OTHER): Admitting: Obstetrics & Gynecology
